# Patient Record
Sex: Male | Born: 1955 | Race: White | Hispanic: No | Marital: Married | Smoking: Former smoker
Health system: Southern US, Community
[De-identification: ages and names within clinical notes are randomized; demographics above are authoritative.]

## PROBLEM LIST (undated history)

## (undated) DIAGNOSIS — G5601 Carpal tunnel syndrome, right upper limb: Secondary | ICD-10-CM

## (undated) DIAGNOSIS — K219 Gastro-esophageal reflux disease without esophagitis: Secondary | ICD-10-CM

## (undated) DIAGNOSIS — Z98811 Dental restoration status: Secondary | ICD-10-CM

## (undated) DIAGNOSIS — M1990 Unspecified osteoarthritis, unspecified site: Secondary | ICD-10-CM

## (undated) DIAGNOSIS — I10 Essential (primary) hypertension: Secondary | ICD-10-CM

## (undated) HISTORY — PX: COLONOSCOPY: SHX174

## (undated) HISTORY — PX: NASAL SINUS SURGERY: SHX719

## (undated) HISTORY — DX: Essential (primary) hypertension: I10

## (undated) HISTORY — DX: Gastro-esophageal reflux disease without esophagitis: K21.9

---

## 1997-02-24 HISTORY — PX: LASIK: SHX215

## 1999-02-25 HISTORY — PX: SHOULDER ARTHROSCOPY W/ ROTATOR CUFF REPAIR: SHX2400

## 2000-06-30 ENCOUNTER — Emergency Department (HOSPITAL_COMMUNITY): Admission: EM | Admit: 2000-06-30 | Discharge: 2000-06-30 | Payer: Self-pay | Admitting: Emergency Medicine

## 2012-09-21 DIAGNOSIS — R509 Fever, unspecified: Secondary | ICD-10-CM

## 2013-06-30 ENCOUNTER — Telehealth: Payer: Self-pay | Admitting: Cardiology

## 2013-06-30 ENCOUNTER — Other Ambulatory Visit: Payer: Self-pay | Admitting: Cardiology

## 2013-06-30 MED ORDER — LOSARTAN POTASSIUM-HCTZ 100-12.5 MG PO TABS: 1.0000 | ORAL_TABLET | Freq: Every day | ORAL | Status: DC

## 2013-06-30 MED ORDER — LOSARTAN POTASSIUM-HCTZ 100-25 MG PO TABS: 1.0000 | ORAL_TABLET | Freq: Every day | ORAL | Status: DC

## 2013-06-30 NOTE — Telephone Encounter (Signed)
Rx refill  Losartan-HCTZ

## 2013-06-30 NOTE — Telephone Encounter (Signed)
New message     Losartan  hctz . Refill

## 2013-06-30 NOTE — Telephone Encounter (Signed)
Rx Correction

## 2013-07-07 ENCOUNTER — Other Ambulatory Visit: Payer: Self-pay | Admitting: Cardiology

## 2013-07-07 MED ORDER — LOSARTAN POTASSIUM-HCTZ 100-12.5 MG PO TABS: 1.0000 | ORAL_TABLET | Freq: Every day | ORAL | Status: DC

## 2013-10-09 ENCOUNTER — Encounter: Payer: Self-pay | Admitting: *Deleted

## 2014-01-19 ENCOUNTER — Other Ambulatory Visit: Payer: Self-pay | Admitting: Cardiology

## 2014-01-23 ENCOUNTER — Other Ambulatory Visit: Payer: Self-pay

## 2014-02-08 ENCOUNTER — Encounter: Payer: Self-pay | Admitting: Internal Medicine

## 2014-02-16 ENCOUNTER — Other Ambulatory Visit: Payer: Self-pay | Admitting: Cardiology

## 2014-03-19 ENCOUNTER — Other Ambulatory Visit: Payer: Self-pay | Admitting: Cardiology

## 2014-03-23 ENCOUNTER — Other Ambulatory Visit: Payer: Self-pay

## 2014-03-23 ENCOUNTER — Encounter: Payer: Self-pay | Admitting: Gastroenterology

## 2014-03-23 ENCOUNTER — Ambulatory Visit (INDEPENDENT_AMBULATORY_CARE_PROVIDER_SITE_OTHER): Payer: BC Managed Care – PPO | Admitting: Gastroenterology

## 2014-03-23 VITALS — BP 139/87 | HR 85 | Temp 97.8°F | Ht 68.0 in | Wt 193.8 lb

## 2014-03-23 DIAGNOSIS — K219 Gastro-esophageal reflux disease without esophagitis: Secondary | ICD-10-CM | POA: Insufficient documentation

## 2014-03-23 DIAGNOSIS — R1314 Dysphagia, pharyngoesophageal phase: Secondary | ICD-10-CM

## 2014-03-23 DIAGNOSIS — R195 Other fecal abnormalities: Secondary | ICD-10-CM | POA: Insufficient documentation

## 2014-03-23 MED ORDER — PEG 3350-KCL-NA BICARB-NACL 420 G PO SOLR: 4000.0000 mL | Freq: Once | ORAL | Status: DC

## 2014-03-23 NOTE — Telephone Encounter (Signed)
Ok to refill 

## 2014-03-23 NOTE — Assessment & Plan Note (Signed)
59 year old male with heme positive stool, noted low-volume hematochezia but without any other concerning lower GI features. Last colonoscopy around 9 years ago at age 32 and reportedly normal. Likely benign anorectal source but need to exclude occult process.   Proceed with colonoscopy with Dr. Oneida Alar in the near future. The risks, benefits, and alternatives have been discussed in detail with the patient. They state understanding and desire to proceed.

## 2014-03-23 NOTE — Progress Notes (Signed)
Primary Care Physician:  Monico Blitz, MD Primary Gastroenterologist:  Dr. Oneida Alar   Chief Complaint  Patient presents with  . HEME + STOOLS    HPI:   Derek Wilkerson is a 59 y.o. male presenting today at the request of hist PCP secondary to heme positive stool.   2.5 months ago had prostate infection, given antibiotics, started having black stool for 2 weeks. Finished antibiotics and black stool went away. Low-volume hematochezia. Bright-red tinged stool. Bristol scale #4, three times a day, usually after eating. Normal baseline for patient. No changes in bowel habits.  Last colonoscopy about 9  years ago at age 73.  No unintentional weight loss.   History of intermittent chronic GERD, placed on Protonix recently with much improvement. Occasionally will have solid food dysphagia, canned tuna, cornbread.   Past Medical History  Diagnosis Date  . Familial hyperlipidemia   . Hypertension   . Back pain   . Leg cramping   . GERD (gastroesophageal reflux disease)     Past Surgical History  Procedure Laterality Date  . Colonoscopy  10 yrs    per patient was normal  . Shoulder arthroscopy w/ rotator cuff repair      right  . Nasal sinus surgery      Current Outpatient Prescriptions  Medication Sig Dispense Refill  . losartan-hydrochlorothiazide (HYZAAR) 100-12.5 MG per tablet TAKE 1 TABLET BY MOUTH EVERY DAY 30 tablet 0  . Multiple Vitamin (MULTIVITAMIN) tablet Take 1 tablet by mouth daily.    . Omega-3 Fatty Acids (FISH OIL) 1000 MG CAPS Take by mouth daily.    . pantoprazole (PROTONIX) 40 MG tablet Take 40 mg by mouth daily.   1  . saw palmetto 500 MG capsule Take 500 mg by mouth daily.    . Testosterone 20.25 MG/ACT (1.62%) GEL Place onto the skin daily.    . polyethylene glycol-electrolytes (NULYTELY/GOLYTELY) 420 G solution Take 4,000 mLs by mouth once. 4000 mL 0   No current facility-administered medications for this visit.    Allergies as of 03/23/2014  . (No Known  Allergies)    Family History  Problem Relation Age of Onset  . Colon cancer Maternal Grandmother     History   Social History  . Marital Status: Married    Spouse Name: N/A    Number of Children: N/A  . Years of Education: N/A   Occupational History  . [employer]    Social History Main Topics  . Smoking status: Never Smoker   . Smokeless tobacco: Not on file  . Alcohol Use: 0.0 oz/week    0 Not specified per week     Comment: social drinker   . Drug Use: No  . Sexual Activity: Not on file   Other Topics Concern  . Not on file   Social History Narrative    Review of Systems: As mentioned in HPI  Physical Exam: BP 139/87 mmHg  Pulse 85  Temp(Src) 97.8 F (36.6 C) (Oral)  Ht 5\' 8"  (1.727 m)  Wt 193 lb 12.8 oz (87.907 kg)  BMI 29.47 kg/m2 General:   Alert and oriented. Pleasant and cooperative. Well-nourished and well-developed.  Head:  Normocephalic and atraumatic. Eyes:  Without icterus, sclera clear and conjunctiva pink.  Ears:  Normal auditory acuity. Nose:  No deformity, discharge,  or lesions. Mouth:  No deformity or lesions, oral mucosa pink.  Lungs:  Clear to auscultation bilaterally. No wheezes, rales, or rhonchi. No distress.  Heart:  S1, S2 present without murmurs appreciated.  Abdomen:  +BS, soft, non-tender and non-distended. No HSM noted. No guarding or rebound. No masses appreciated.  Rectal:  Deferred  Msk:  Symmetrical without gross deformities. Normal posture. Extremities:  Without clubbing or edema. Neurologic:  Alert and  oriented x4;  grossly normal neurologically. Skin:  Intact without significant lesions or rashes. Psych:  Alert and cooperative. Normal mood and affect.

## 2014-03-23 NOTE — Assessment & Plan Note (Signed)
Chronic mild intermittent GERD symptoms, improved with Protonix daily. Rare solid food dysphagia, mostly with dry foods. Risk factors for Barrett's include greater than age 60, caucasian, chronic GERD. Dysphagia likely secondary to food choices but unable to exclude occult web, ring, or stricture.   Proceed with upper endoscopy and dilatation in the near future with Dr. Oneida Alar. The risks, benefits, and alternatives have been discussed in detail with patient. They have stated understanding and desire to proceed.  Continue Protonix once daily.

## 2014-03-23 NOTE — Assessment & Plan Note (Signed)
EGD/ED as planned.  

## 2014-03-23 NOTE — Patient Instructions (Signed)
We have scheduled you for a colonoscopy, upper endoscopy, and dilation with Dr. Fields in the near future.  Further recommendations to follow!   

## 2014-03-23 NOTE — Progress Notes (Signed)
cc'ed to pcp °

## 2014-03-30 ENCOUNTER — Other Ambulatory Visit: Payer: Self-pay | Admitting: *Deleted

## 2014-03-30 MED ORDER — LOSARTAN POTASSIUM-HCTZ 100-12.5 MG PO TABS: 1.0000 | ORAL_TABLET | Freq: Every day | ORAL | Status: DC

## 2014-03-30 NOTE — Telephone Encounter (Signed)
Looks like you took care of this. Rx message kept coming up from [facility] and I just sent it back to you to address. Candee Furbish, MD

## 2014-03-30 NOTE — Telephone Encounter (Signed)
Pam, please look into this. Thanks.  Candee Furbish, MD

## 2014-03-31 ENCOUNTER — Encounter (HOSPITAL_COMMUNITY): Payer: Self-pay | Admitting: *Deleted

## 2014-03-31 ENCOUNTER — Encounter (HOSPITAL_COMMUNITY)
Admission: RE | Disposition: A | Discharge status: Home / Self Care | Payer: Self-pay | Source: Ambulatory Visit | Attending: Gastroenterology

## 2014-03-31 ENCOUNTER — Ambulatory Visit (HOSPITAL_COMMUNITY)
Admission: RE | Admit: 2014-03-31 | Discharge: 2014-03-31 | Disposition: A | Discharge status: Home / Self Care | Payer: BC Managed Care – PPO | Source: Ambulatory Visit | Attending: Gastroenterology | Admitting: Gastroenterology

## 2014-03-31 DIAGNOSIS — K2980 Duodenitis without bleeding: Secondary | ICD-10-CM | POA: Diagnosis not present

## 2014-03-31 DIAGNOSIS — R1310 Dysphagia, unspecified: Secondary | ICD-10-CM | POA: Insufficient documentation

## 2014-03-31 DIAGNOSIS — D125 Benign neoplasm of sigmoid colon: Secondary | ICD-10-CM | POA: Diagnosis not present

## 2014-03-31 DIAGNOSIS — R1314 Dysphagia, pharyngoesophageal phase: Secondary | ICD-10-CM

## 2014-03-31 DIAGNOSIS — D123 Benign neoplasm of transverse colon: Secondary | ICD-10-CM | POA: Diagnosis not present

## 2014-03-31 DIAGNOSIS — K921 Melena: Secondary | ICD-10-CM | POA: Diagnosis present

## 2014-03-31 DIAGNOSIS — K648 Other hemorrhoids: Secondary | ICD-10-CM | POA: Diagnosis not present

## 2014-03-31 DIAGNOSIS — K449 Diaphragmatic hernia without obstruction or gangrene: Secondary | ICD-10-CM | POA: Diagnosis not present

## 2014-03-31 DIAGNOSIS — R195 Other fecal abnormalities: Secondary | ICD-10-CM | POA: Insufficient documentation

## 2014-03-31 DIAGNOSIS — K222 Esophageal obstruction: Secondary | ICD-10-CM | POA: Diagnosis not present

## 2014-03-31 DIAGNOSIS — K2970 Gastritis, unspecified, without bleeding: Secondary | ICD-10-CM | POA: Insufficient documentation

## 2014-03-31 DIAGNOSIS — K219 Gastro-esophageal reflux disease without esophagitis: Secondary | ICD-10-CM

## 2014-03-31 HISTORY — PX: COLONOSCOPY: SHX5424

## 2014-03-31 HISTORY — PX: SAVORY DILATION: SHX5439

## 2014-03-31 HISTORY — PX: ESOPHAGOGASTRODUODENOSCOPY: SHX5428

## 2014-03-31 HISTORY — PX: MALONEY DILATION: SHX5535

## 2014-03-31 SURGERY — COLONOSCOPY
Anesthesia: Moderate Sedation

## 2014-03-31 MED ORDER — MIDAZOLAM HCL 5 MG/5ML IJ SOLN
INTRAMUSCULAR | Status: AC
  Filled 2014-03-31: qty 10

## 2014-03-31 MED ORDER — MEPERIDINE HCL 100 MG/ML IJ SOLN
INTRAMUSCULAR | Status: AC
  Filled 2014-03-31: qty 2

## 2014-03-31 MED ORDER — SPOT INK MARKER SYRINGE KIT
PACK | SUBMUCOSAL | Status: DC | PRN
  Administered 2014-03-31: 2 mL via SUBMUCOSAL

## 2014-03-31 MED ORDER — LIDOCAINE VISCOUS 2 % MT SOLN
OROMUCOSAL | Status: AC
  Filled 2014-03-31: qty 15

## 2014-03-31 MED ORDER — PROMETHAZINE HCL 25 MG/ML IJ SOLN
INTRAMUSCULAR | Status: DC | PRN
  Administered 2014-03-31: 12.5 mg via INTRAVENOUS

## 2014-03-31 MED ORDER — SODIUM CHLORIDE 0.9 % IV SOLN
INTRAVENOUS | Status: DC
  Administered 2014-03-31: 13:00:00 via INTRAVENOUS

## 2014-03-31 MED ORDER — LIDOCAINE VISCOUS 2 % MT SOLN
OROMUCOSAL | Status: DC | PRN
  Administered 2014-03-31: 1 via OROMUCOSAL

## 2014-03-31 MED ORDER — SODIUM CHLORIDE 0.9 % IJ SOLN
INTRAMUSCULAR | Status: AC
  Filled 2014-03-31: qty 3

## 2014-03-31 MED ORDER — STERILE WATER FOR IRRIGATION IR SOLN
Status: DC | PRN
  Administered 2014-03-31: 13:00:00

## 2014-03-31 MED ORDER — PANTOPRAZOLE SODIUM 40 MG PO TBEC: 40.0000 mg | DELAYED_RELEASE_TABLET | Freq: Two times a day (BID) | ORAL | Status: DC

## 2014-03-31 MED ORDER — MIDAZOLAM HCL 5 MG/5ML IJ SOLN
INTRAMUSCULAR | Status: DC | PRN
  Administered 2014-03-31 (×2): 2 mg via INTRAVENOUS
  Administered 2014-03-31: 1 mg via INTRAVENOUS
  Administered 2014-03-31: 2 mg via INTRAVENOUS

## 2014-03-31 MED ORDER — PROMETHAZINE HCL 25 MG/ML IJ SOLN
INTRAMUSCULAR | Status: AC
  Filled 2014-03-31: qty 1

## 2014-03-31 MED ORDER — MEPERIDINE HCL 100 MG/ML IJ SOLN
INTRAMUSCULAR | Status: DC | PRN
  Administered 2014-03-31: 50 mg via INTRAVENOUS
  Administered 2014-03-31: 25 mg via INTRAVENOUS

## 2014-03-31 NOTE — Discharge Instructions (Signed)
You had 2 polyps removed. I TATTOOED THE BASE OF THE LARGEST ONE. YOUR RECTAL BLEEDING IS DUE TO THE LARGE POLYP AND HEMORRHOIDS. You have internal hemorrhoids and diverticulosis IN YOUR LEFT COLON. 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 You have MILD gastritis AND DUODENITIS. I biopsied your stomach.    CONTINUE PROTONIX. INCREASE TO 30 MINUTES PRIOR TO YOUR FIRST MEAL FOREVER.   FOLLOW A HIGH FIBER/LOW FAT DIET. AVOID ITEMS THAT CAUSE BLOATING. SEE INFO BELOW.  YOUR BIOPSY RESULTS WILL BE AVAILABLE IN MY CHART AFTER FEB 9  OR MY OFFICE WILL CONTACT YOU IN 10-14 DAYS WITH YOUR RESULTS.   FOLLOW UP IN 3 MOS.  Next colonoscopy in 1-3 years. YOUR SISTERS, BROTHERS, CHILDREN, AND PARENTS NEED TO HAVE A COLONOSCOPY STARTING AT THE AGE OF 40.    ENDOSCOPY Care After Read the instructions outlined below and refer to this sheet in the next week. These discharge instructions provide you with general information on caring for yourself after you leave the hospital. While your treatment has been planned according to the most current medical practices available, unavoidable complications occasionally occur. If you have any problems or questions after discharge, call DR. Wilhelmenia Addis, [phone].  ACTIVITY  You may resume your regular activity, but move at a slower pace for the next 24 hours.   Take frequent rest periods for the next 24 hours.   Walking will help get rid of the air and reduce the bloated feeling in your belly (abdomen).   No driving for 24 hours (because of the medicine (anesthesia) used during the test).   You may shower.   Do not sign any important legal documents or operate any machinery for 24 hours (because of the anesthesia used during the test).    NUTRITION  Drink plenty of fluids.   You may resume your normal diet as instructed by your doctor.   Begin with a light meal and progress to your normal diet. Heavy or fried foods are harder to digest and may make you  feel sick to your stomach (nauseated).   Avoid alcoholic beverages for 24 hours or as instructed.    MEDICATIONS  You may resume your normal medications.   WHAT YOU CAN EXPECT TODAY  Some feelings of bloating in the abdomen.   Passage of more gas than usual.   Spotting of blood in your stool or on the toilet paper  .  IF YOU HAD POLYPS REMOVED DURING THE ENDOSCOPY:  Eat a soft diet IF YOU HAVE NAUSEA, BLOATING, ABDOMINAL PAIN, OR VOMITING.    FINDING OUT THE RESULTS OF YOUR TEST Not all test results are available during your visit. DR. Oneida Alar WILL CALL YOU WITHIN 14 DAYS OF YOUR PROCEDUE WITH YOUR RESULTS. Do not assume everything is normal if you have not heard from DR. Eldor Conaway, CALL HER OFFICE AT [phone].  SEEK IMMEDIATE MEDICAL ATTENTION AND CALL THE OFFICE: [phone] IF:  You have more than a spotting of blood in your stool.   Your belly is swollen (abdominal distention).   You are nauseated or vomiting.   You have a temperature over 101F.   You have abdominal pain or discomfort that is severe or gets worse throughout the day.  High-Fiber Diet A high-fiber diet changes your normal diet to include more whole grains, legumes, fruits, and vegetables. Changes in the diet involve replacing refined carbohydrates with unrefined foods. The calorie level of the diet is essentially unchanged. The Dietary Reference Intake (recommended amount) for  adult males is 38 grams per day. For adult females, it is 25 grams per day. Pregnant and lactating women should consume 28 grams of fiber per day. Fiber is the intact part of a plant that is not broken down during digestion. Functional fiber is fiber that has been isolated from the plant to provide a beneficial effect in the body. PURPOSE  Increase stool bulk.   Ease and regulate bowel movements.   Lower cholesterol.  INDICATIONS THAT YOU NEED MORE FIBER  Constipation and hemorrhoids.   Uncomplicated diverticulosis  (intestine condition) and irritable bowel syndrome.   Weight management.   As a protective measure against hardening of the arteries (atherosclerosis), diabetes, and cancer.   GUIDELINES FOR INCREASING FIBER IN THE DIET  Start adding fiber to the diet slowly. A gradual increase of about 5 more grams (2 slices of whole-wheat bread, 2 servings of most fruits or vegetables, or 1 bowl of high-fiber cereal) per day is best. Too rapid an increase in fiber may result in constipation, flatulence, and bloating.   Drink enough water and fluids to keep your urine clear or pale yellow. Water, juice, or caffeine-free drinks are recommended. Not drinking enough fluid may cause constipation.   Eat a variety of high-fiber foods rather than one type of fiber.   Try to increase your intake of fiber through using high-fiber foods rather than fiber pills or supplements that contain small amounts of fiber.   The goal is to change the types of food eaten. Do not supplement your present diet with high-fiber foods, but replace foods in your present diet.  INCLUDE A VARIETY OF FIBER SOURCES  Replace refined and processed grains with whole grains, canned fruits with fresh fruits, and incorporate other fiber sources. White rice, white breads, and most bakery goods contain little or no fiber.   Brown whole-grain rice, buckwheat oats, and many fruits and vegetables are all good sources of fiber. These include: broccoli, Brussels sprouts, cabbage, cauliflower, beets, sweet potatoes, white potatoes (skin on), carrots, tomatoes, eggplant, squash, berries, fresh fruits, and dried fruits.   Cereals appear to be the richest source of fiber. Cereal fiber is found in whole grains and bran. Bran is the fiber-rich outer coat of cereal grain, which is largely removed in refining. In whole-grain cereals, the bran remains. In breakfast cereals, the largest amount of fiber is found in those with "bran" in their names. The fiber content  is sometimes indicated on the label.   You may need to include additional fruits and vegetables each day.   In baking, for 1 cup white flour, you may use the following substitutions:   1 cup whole-wheat flour minus 2 tablespoons.   1/2 cup white flour plus 1/2 cup whole-wheat flour.    Low-Fat Diet BREADS, CEREALS, PASTA, RICE, DRIED PEAS, AND BEANS These products are high in carbohydrates and most are low in fat. Therefore, they can be increased in the diet as substitutes for fatty foods. They too, however, contain calories and should not be eaten in excess. Cereals can be eaten for snacks as well as for breakfast.  Include foods that contain fiber (fruits, vegetables, whole grains, and legumes). Research shows that fiber may lower blood cholesterol levels, especially the water-soluble fiber found in fruits, vegetables, oat products, and legumes. FRUITS AND VEGETABLES It is good to eat fruits and vegetables. Besides being sources of fiber, both are rich in vitamins and some minerals. They help you get the daily allowances of these nutrients.  Fruits and vegetables can be used for snacks and desserts. MEATS Limit lean meat, chicken, [place], and fish to no more than 6 ounces per day. Beef, Pork, and Lamb Use lean cuts of beef, pork, and lamb. Lean cuts include:  Extra-lean ground beef.  Arm roast.  Sirloin tip.  Center-cut ham.  Round steak.  Loin chops.  Rump roast.  Tenderloin.  Trim all fat off the outside of meats before cooking. It is not necessary to severely decrease the intake of red meat, but lean choices should be made. Lean meat is rich in protein and contains a highly absorbable form of iron. Premenopausal women, in particular, should avoid reducing lean red meat because this could increase the risk for low red blood cells (iron-deficiency anemia).  Chicken and [place] These are good sources of protein. The fat of poultry can be reduced by removing the skin and underlying fat  layers before cooking. Chicken and [place] can be substituted for lean red meat in the diet. Poultry should not be fried or covered with high-fat sauces. Fish and Shellfish Fish is a good source of protein. Shellfish contain cholesterol, but they usually are low in saturated fatty acids. The preparation of fish is important. Like chicken and [place], they should not be fried or covered with high-fat sauces. EGGS Egg whites contain no fat or cholesterol. They can be eaten often. Try 1 to 2 egg whites instead of whole eggs in recipes or use egg substitutes that do not contain yolk.  MILK AND DAIRY PRODUCTS Use skim or 1% milk instead of 2% or whole milk. Decrease whole milk, natural, and processed cheeses. Use nonfat or low-fat (2%) cottage cheese or low-fat cheeses made from vegetable oils. Choose nonfat or low-fat (1 to 2%) yogurt. Experiment with evaporated skim milk in recipes that call for heavy cream. Substitute low-fat yogurt or low-fat cottage cheese for sour cream in dips and salad dressings. Have at least 2 servings of low-fat dairy products, such as 2 glasses of skim (or 1%) milk each day to help get your daily calcium intake.  FATS AND OILS Butterfat, lard, and beef fats are high in saturated fat and cholesterol. These should be avoided.Vegetable fats do not contain cholesterol. AVOID coconut oil, palm oil, and palm kernel oil, WHICH are very high in saturated fats. These should be limited. These fats are often used in bakery goods, processed foods, popcorn, oils, and nondairy creamers. Vegetable shortenings and some peanut butters contain hydrogenated oils, which are also saturated fats. Read the labels on these foods and check for saturated vegetable oils.  Desirable liquid vegetable oils are corn oil, cottonseed oil, olive oil, canola oil, safflower oil, soybean oil, and sunflower oil. Peanut oil is not as good, but small amounts are acceptable. Buy a heart-healthy tub margarine that has no  partially hydrogenated oils in the ingredients. AVOID Mayonnaise and salad dressings often are made from unsaturated fats.  OTHER EATING TIPS Snacks  Most sweets should be limited as snacks. They tend to be rich in calories and fats, and their caloric content outweighs their nutritional value. Some good choices in snacks are graham crackers, melba toast, soda crackers, bagels (no egg), English muffins, fruits, and vegetables. These snacks are preferable to snack crackers, [place] fries, and chips. Popcorn should be air-popped or cooked in small amounts of liquid vegetable oil.  Desserts Eat fruit, low-fat yogurt, and fruit ices instead of pastries, cake, and cookies. Sherbet, angel food cake, gelatin dessert, frozen low-fat yogurt, or  other frozen products that do not contain saturated fat (pure fruit juice bars, frozen ice pops) are also acceptable.   COOKING METHODS Choose those methods that use little or no fat. They include: Poaching.  Braising.  Steaming.  Grilling.  Baking.  Stir-frying.  Broiling.  Microwaving.  Foods can be cooked in a nonstick pan without added fat, or use a nonfat cooking spray in regular cookware. Limit fried foods and avoid frying in saturated fat. Add moisture to lean meats by using water, broth, cooking wines, and other nonfat or low-fat sauces along with the cooking methods mentioned above. Soups and stews should be chilled after cooking. The fat that forms on top after a few hours in the refrigerator should be skimmed off. When preparing meals, avoid using excess salt. Salt can contribute to raising blood pressure in some people.  EATING AWAY FROM HOME Order entres, potatoes, and vegetables without sauces or butter. When meat exceeds the size of a deck of cards (3 to 4 ounces), the rest can be taken home for another meal. Choose vegetable or fruit salads and ask for low-calorie salad dressings to be served on the side. Use dressings sparingly. Limit high-fat  toppings, such as bacon, crumbled eggs, cheese, sunflower seeds, and olives. Ask for heart-healthy tub margarine instead of butter.   Polyps, Colon  A polyp is extra tissue that grows inside your body. Colon polyps grow in the large intestine. The large intestine, also called the colon, is part of your digestive system. It is a long, hollow tube at the end of your digestive tract where your body makes and stores stool. Most polyps are not dangerous. They are benign. This means they are not cancerous. But over time, some types of polyps can turn into cancer. Polyps that are smaller than a pea are usually not harmful. But larger polyps could someday become or may already be cancerous. To be safe, doctors remove all polyps and test them.   WHO GETS POLYPS? Anyone can get polyps, but certain people are more likely than others. You may have a greater chance of getting polyps if:  You are over 50.   You have had polyps before.   Someone in your family has had polyps.   Someone in your family has had cancer of the large intestine.   Find out if someone in your family has had polyps. You may also be more likely to get polyps if you:   Eat a lot of fatty foods   Smoke   Drink alcohol   Do not exercise  Eat too much   TREATMENT  The caregiver will remove the polyp during sigmoidoscopy or colonoscopy.  PREVENTION There is not one sure way to prevent polyps. You might be able to lower your risk of getting them if you:  Eat more fruits and vegetables and less fatty food.   Do not smoke.   Avoid alcohol.   Exercise every day.   Lose weight if you are overweight.   Eating more calcium and folate can also lower your risk of getting polyps. Some foods that are rich in calcium are milk, cheese, and broccoli. Some foods that are rich in folate are chickpeas, kidney beans, and spinach.   Diverticulosis Diverticulosis is a common condition that develops when small pouches (diverticula) form  in the wall of the colon. The risk of diverticulosis increases with age. It happens more often in people who eat a low-fiber diet. Most individuals with diverticulosis have no  symptoms. Those individuals with symptoms usually experience belly (abdominal) pain, constipation, or loose stools (diarrhea).  HOME CARE INSTRUCTIONS  Increase the amount of fiber in your diet as directed by your caregiver or dietician. This may reduce symptoms of diverticulosis.   Drink at least 6 to 8 glasses of water each day to prevent constipation.   Try not to strain when you have a bowel movement.   Avoiding nuts and seeds to prevent complications is still an uncertain benefit.   FOODS HAVING HIGH FIBER CONTENT INCLUDE:  Fruits. Apple, peach, pear, tangerine, raisins, prunes.   Vegetables. Brussels sprouts, asparagus, broccoli, cabbage, carrot, cauliflower, romaine lettuce, spinach, summer squash, tomato, winter squash, zucchini.   Starchy Vegetables. Baked beans, kidney beans, lima beans, split peas, lentils, potatoes (with skin).   Grains. Whole wheat bread, brown rice, bran flake cereal, plain oatmeal, white rice, shredded wheat, bran muffins.   SEEK IMMEDIATE MEDICAL CARE IF:  You develop increasing pain or severe bloating.   You have an oral temperature above 101F.   You develop vomiting or bowel movements that are bloody or black.   Hemorrhoids Hemorrhoids are dilated (enlarged) veins around the rectum. Sometimes clots will form in the veins. This makes them swollen and painful. These are called thrombosed hemorrhoids. Causes of hemorrhoids include:  Constipation.   Straining to have a bowel movement.   HEAVY LIFTING HOME CARE INSTRUCTIONS  Eat a well balanced diet and drink 6 to 8 glasses of water every day to avoid constipation. You may also use a bulk laxative.   Avoid straining to have bowel movements.   Keep anal area dry and clean.   Do not use a donut shaped pillow or sit on  the toilet for long periods. This increases blood pooling and pain.   Move your bowels when your body has the urge; this will require less straining and will decrease pain and pressure.

## 2014-03-31 NOTE — Progress Notes (Signed)
REVIEWED-NO ADDITIONAL RECOMMENDATIONS. 

## 2014-03-31 NOTE — H&P (Signed)
  Primary Care Physician:  Monico Blitz, MD Primary Gastroenterologist:  Dr. Oneida Alar  Pre-Procedure History & Physical: HPI:  Derek Wilkerson is a 59 y.o. male here for SCREENING/dyspepsia/DYSPHAGIA.  Past Medical History  Diagnosis Date  . Familial hyperlipidemia   . Hypertension   . Back pain   . Leg cramping   . GERD (gastroesophageal reflux disease)    Past Surgical History  Procedure Laterality Date  . Colonoscopy  10 yrs    per patient was normal  . Shoulder arthroscopy w/ rotator cuff repair      right  . Nasal sinus surgery     Prior to Admission medications   Medication Sig Start Date End Date Taking? Authorizing Provider  Cholecalciferol (VITAMIN D-3) 1000 UNITS CAPS Take 2 capsules by mouth daily.   Yes Historical Provider, MD  glucosamine-chondroitin 500-400 MG tablet Take 1 tablet by mouth 3 (three) times daily.   Yes Historical Provider, MD  OVER THE COUNTER MEDICATION Take 1 capsule by mouth daily. Super Beta Prostate   Yes Historical Provider, MD  pantoprazole (PROTONIX) 40 MG tablet Take 40 mg by mouth daily.  03/14/14  Yes Historical Provider, MD  pyridOXINE (VITAMIN B-6) 100 MG tablet Take 100 mg by mouth daily.   Yes Historical Provider, MD  saw palmetto 500 MG capsule Take 500 mg by mouth 2 (two) times daily.    Yes Historical Provider, MD  sertraline (ZOLOFT) 100 MG tablet Take 150 mg by mouth daily. 02/16/14  Yes Historical Provider, MD  Testosterone 20.25 MG/ACT (1.62%) GEL Place onto the skin daily.   Yes Historical Provider, MD  losartan-hydrochlorothiazide (HYZAAR) 100-12.5 MG per tablet Take 1 tablet by mouth daily. 03/30/14   Candee Furbish, MD  polyethylene glycol-electrolytes (NULYTELY/GOLYTELY) 420 G solution Take 4,000 mLs by mouth once. 03/23/14   Orvil Feil, NP    Allergies as of 03/23/2014  . (No Known Allergies)    Family History  Problem Relation Age of Onset  . Colon cancer Maternal Grandmother     History   Social History  . Marital Status:  Married    Spouse Name: N/A    Number of Children: N/A  . Years of Education: N/A   Occupational History  . [employer]    Social History Main Topics  . Smoking status: Never Smoker   . Smokeless tobacco: Not on file  . Alcohol Use: 0.0 oz/week    0 Not specified per week     Comment: social drinker   . Drug Use: No  . Sexual Activity: Not on file   Other Topics Concern  . Not on file   Social History Narrative    Review of Systems: See HPI, otherwise negative ROS   Physical Exam: There were no vitals taken for this visit. General:   Alert,  pleasant and cooperative in NAD Head:  Normocephalic and atraumatic. Neck:  Supple; Lungs:  Clear throughout to auscultation.    Heart:  Regular rate and rhythm. Abdomen:  Soft, nontender and nondistended. Normal bowel sounds, without guarding, and without rebound.   Neurologic:  Alert and  oriented x4;  grossly normal neurologically.  Impression/Plan:    SCREENING/dyspepsia/DYSPHAGIA. Plan:  1. TCS/EGD/?DIL TODAY

## 2014-04-01 NOTE — Op Note (Signed)
Derek Wilkerson, Derek Wilkerson                  ACCOUNT NO.:  [id number]  MEDICAL RECORD NO.:  [id number]  LOCATION:  [facility]                          FACILITY:  [facility]  PHYSICIAN:  Barney Drain, M.D.     DATE OF BIRTH:  1955-07-23  DATE OF PROCEDURE:  03/31/2014 DATE OF DISCHARGE:  03/31/2014                              OPERATIVE REPORT   REFERRING PROVIDER:  Monico Blitz, MD  PROCEDURE:  Esophagogastroduodenoscopy with cold forceps biopsy and Savary dilation to 16 mm.  INDICATION FOR EXAM:  Derek Wilkerson is a 59 year old male who presents with black stool after taking antibiotics.  He also has intermittent solid dysphagia.  He also has a history of gastroesophageal reflux disease.  FINDINGS: 1. Distal esophageal stricture associated with erosion and erythema     that narrows or limited to approximately 12 to 13 mm. 2. Moderate hiatal hernia. 3. Mild erythema and edema without erosions in the antrum. 4. Mild erythema and edema in the duodenal bulb.  Normal second     portion of the duodenum.  IMPRESSION: 1. Peptic stricture. 2. Moderate-sized hiatal hernia. 3. Mild gastritis and duodenitis.  No obvious source for black stools     identified.  RECOMMENDATIONS: 1. Protonix 30 minutes before meals twice daily. 2. Await biopsy. 3. Follow up in 3 months.  MEDICATIONS:  In addition to meds given during colonoscopy, Demerol 25 mg IV and Versed 2 mg IV.  PROCEDURE TECHNIQUE:  Physical exam was performed.  Informed consent was obtained from the patient after explaining the benefits, risks, and alternatives to the procedure.  The patient was connected to the monitor and placed in left lateral position.  Continuous oxygen was provided by nasal cannula and IV medicine administered through an indwelling cannula.  After administration of sedation, the patient's esophagus was intubated and the scope was advanced under direct visualization to the second portion of the duodenum.  Prior to withdrawal of the  scope, the Savary guidewire was placed.  The scope was removed slowly by careful examining the color, texture, anatomy, and integrity of the mucosa on the way out.  The esophagus was dilated from 12.8 to 16 mm.  A 16 MM dilator was passed with mild resistance.  Trace heme was evident after dilation.  The dilator and the guidewire were removed.  The patient was recovered in endoscopy and discharged home in satisfactory condition.     Barney Drain, M.D.     SF/MEDQ  D:  03/31/2014  T:  04/01/2014  Job:  [id number]

## 2014-04-01 NOTE — Op Note (Signed)
NAMEUDAY, JANTZ                  ACCOUNT NO.:  [id number]  MEDICAL RECORD NO.:  [id number]  LOCATION:  [facility]                          FACILITY:  [facility]  PHYSICIAN:  Barney Drain, M.D.     DATE OF BIRTH:  [birth year]  DATE OF PROCEDURE:  03/31/2014 DATE OF DISCHARGE:  03/31/2014                              OPERATIVE REPORT   PROCEDURE:  Colonoscopy with cold forceps, snare cautery, polypectomy, and spot tattoo injection.  REFERRING PROVIDER:  Monico Blitz, MD  INDICATION FOR EXAM:  Mr. Glasscock is a 59 year old male who complained of black stool and rectal bleeding.  FINDINGS: 1. Normal terminal ileum approximately 10 cm visualized. 2. Redundant left colon. 3. A 1.2 cm semi-pedunculated sigmoid colon polyp removed via snare     cautery.  1 mL spot tattoo injected at the base. 4. A 4 mm sessile hepatic flexure polyp removed via cold forceps. 5. Frequent sigmoid diverticula with angulation. 6. Moderate internal and external hemorrhoids.  IMPRESSION: 1. Rectal bleeding due to large sigmoid colon polyp and internal     hemorrhoids. 2. Moderate external hemorrhoids. 3. Moderate diverticulosis with muscular hypertrophy.  RECOMMENDATIONS: 1. Await biopsies. 2. High-fiber diet. 3. Next colonoscopy in 1 to 3 years.  MEDICATIONS: 1. Demerol 75 mg IV. 2. Versed 5 mg IV. 3. Phenergan 12.5 mg IV.  CECAL WITHDRAWAL TIME:  25 minutes.  PROCEDURE TECHNIQUE:  Physical exam was performed.  Informed consent was obtained from the patient after explaining the benefits, risks, and alternatives to procedure.  The patient was connected monitored and placed in left lateral position.  Continuous oxygen was provided by nasal cannula.  IV medicine administered through an indwelling cannula. After administration of sedation and rectal exam, the patient's rectum was intubated and scope advanced under direct visualization to the distal terminal ileum.  The scope was removed slowly by careful exam of  color, texture, anatomy, and integrity of the mucosa on the way out.  Patient was recovered in endoscopy.  Discharged home in satisfactory condition.     Barney Drain, M.D.     SF/MEDQ  D:  03/31/2014  T:  04/01/2014  Job:  [id number]  cc:   Monico Blitz, MD Fax: [phone]

## 2014-04-02 NOTE — Op Note (Addendum)
[facility] [address] [place], [address]   COLONOSCOPY PROCEDURE REPORT  PATIENT: Raine, Elsass  MR#: [id number] BIRTHDATE: Aug 23, 1955 , 31  yrs. old GENDER: male ENDOSCOPIST: [name], MD REFERRED KX:[id number] Manuella Ghazi, M.D. PROCEDURE DATE:  03/31/2014 PROCEDURE:   Colonoscopy with cold biopsy polypectomy, Colonoscopy with snare polypectomy, and Submucosal injection, any substance INDICATIONS:hematochezia. MEDICATIONS: Demerol 50 mg IV and Versed 5 mg IV  DESCRIPTION OF PROCEDURE:    Physical exam was performed.  Informed consent was obtained from the patient after explaining the benefits, risks, and alternatives to procedure.  The patient was connected to monitor and placed in left lateral position. Continuous oxygen was provided by nasal cannula and IV medicine administered through an indwelling cannula.  After administration of sedation and rectal exam, the patients rectum was intubated and the EC-3890Li ([id number])  colonoscope was advanced under direct visualization to the ileum.  The scope was removed slowly by carefully examining the color, texture, anatomy, and integrity mucosa on the way out.  The patient was recovered in endoscopy and discharged home in satisfactory condition.    COLON FINDINGS: A sessile polyp measuring 3 mm in size was found.  A polypectomy was performed with cold forceps.  , A semi-pedunculated polyp measuring 1.2 cm in size was found in the sigmoid colon.  A polypectomy was performed using snare cautery.  , 1-2 cc SPOT APPLIED AT BASE OF SIGMOID COLON POLYP, and Moderate sized internal hemorrhoids were found.  PREP QUALITY: good. CECAL W/D TIME: 15       minutes COMPLICATIONS: None  ENDOSCOPIC IMPRESSION: ONE LARGE SIGMOID COLON POLYP REMOVED ONE SMALL TRANSVERSE COLON POLYP REMOVED MODERATE INTERNAL HEMORRHOIDS  RECOMMENDATIONS: PROTONIX BID LOW FAT HIGH FIBER DIET AWAIT BIOPSY NEXT TCS IN ONE TO THREE  YEARS      _______________________________ eSignedDanie Binder, MD April 23, 2014 1:22 PM Revised: 2014-04-23 1:22 PM  CPT CODES: ICD CODES:  The ICD and CPT codes recommended by this software are interpretations from the data that the clinical staff has captured with the software.  The verification of the translation of this report to the ICD and CPT codes and modifiers is the sole responsibility of the health care institution and practicing physician where this report was generated.  [employer]. will not be held responsible for the validity of the ICD and CPT codes included on this report.  AMA assumes no liability for data contained or not contained herein. CPT is a Designer, television/film set of the [employer].

## 2014-04-02 NOTE — Op Note (Signed)
[facility] [address] [place], [address]   ENDOSCOPY PROCEDURE REPORT  PATIENT: Derek Wilkerson, Derek Wilkerson  MR#: [id number] BIRTHDATE: [birth year] , 27  yrs. old GENDER: male  ENDOSCOPIST: [name], MD REFFERED IF:OYDXAJ Manuella Ghazi, M.D.  PROCEDURE DATE:  03/31/2014 PROCEDURE:   EGD with biopsy and EGD with dilatation over guidewire   INDICATIONS:1.  hemoccult positive stools.   2.  dysphagia. MEDICATIONS: TCS +  Demerol 25 mg IV and Versed 2 mg IV TOPICAL ANESTHETIC: Viscous Xylocaine  DESCRIPTION OF PROCEDURE:   After the risks benefits and alternatives of the procedure were thoroughly explained, informed consent was obtained.  The EC-3890Li ([id number])  endoscope was introduced through the mouth and advanced to the second portion of the duodenum. The instrument was slowly withdrawn as the mucosa was carefully examined.  Prior to withdrawal of the scope, the guidwire was placed.  The esophagus was dilated successfully.  The patient was recovered in endoscopy and discharged home in satisfactory condition.   ESOPHAGUS: A stricture was found at the gastroesophageal junction. The stenosis was non traversable with the endoscope. STOMACH: Mild non-erosive gastritis (inflammation) was found in the gastric antrum.  Multiple biopsies were performed using cold forceps.   A hiatal hernia was found.   DUODENUM: Mild duodenal inflammation was found in the duodenal bulb.   The duodenal mucosa showed no abnormalities in the 2nd part of the duodenum.   Dilation was then performed at the gastroesphageal junction Dilator: Savary over guidewire Size(s): 72.0-94BS Heme: yes  COMPLICATIONS: There were no immediate complications.  ENDOSCOPIC IMPRESSION: 1.   Stricture at the gastroesophageal junction 2.   MILD Non-erosive gastritis  AND DUODENITIS 3.   MODERATE Hiatal hernia  RECOMMENDATIONS: PROTONIX BID LOW FAT HIGH FIBER DIET AWAIT BIOPSY OPV IN 3 mos TCS IN ONE TO 3  YEARS  eSigned:  [name], MD April 23, 2014 1:49 PM   CPT CODES: ICD CODES:  The ICD and CPT codes recommended by this software are interpretations from the data that the clinical staff has captured with the software.  The verification of the translation of this report to the ICD and CPT codes and modifiers is the sole responsibility of the health care institution and practicing physician where this report was generated.  [facility]. will not be held responsible for the validity of the ICD and CPT codes included on this report.  AMA assumes no liability for data contained or not contained herein. CPT is a Designer, television/film set of the [employer].

## 2014-04-04 ENCOUNTER — Encounter: Payer: Self-pay | Admitting: Gastroenterology

## 2014-04-04 ENCOUNTER — Encounter (HOSPITAL_COMMUNITY): Payer: Self-pay | Admitting: Gastroenterology

## 2014-04-10 ENCOUNTER — Telehealth: Payer: Self-pay | Admitting: Gastroenterology

## 2014-04-10 NOTE — Telephone Encounter (Signed)
Please call pt. He had A LARGE simple adenoma, AND A SMALL SERRATED ADENOMA removed from hIS colon. His stomach Bx shows gastritis.   CONTINUE PROTONIX. INCREASE TO 30 MINUTES PRIOR TO YOUR FIRST MEAL FOREVER.   FOLLOW A HIGH FIBER/LOW FAT DIET. AVOID ITEMS THAT CAUSE BLOATING.   FOLLOW UP IN 3 MOS E30 DYSPHAGIA, COLON POLYPS, GERD  Next colonoscopy in 3 years. YOUR SISTERS, BROTHERS, CHILDREN, AND PARENTS NEED TO HAVE A COLONOSCOPY STARTING AT THE AGE OF 40.

## 2014-04-11 ENCOUNTER — Encounter: Payer: Self-pay | Admitting: Gastroenterology

## 2014-04-11 NOTE — Telephone Encounter (Signed)
APPOINTMENT MADE AND LETTER SENT, ON RECALL FOR TCS °

## 2014-04-12 NOTE — Telephone Encounter (Signed)
LMOM to call.

## 2014-04-12 NOTE — Telephone Encounter (Signed)
Pt is aware of results. 

## 2014-05-17 ENCOUNTER — Telehealth: Payer: Self-pay

## 2014-05-17 NOTE — Telephone Encounter (Signed)
PLEASE CALL PT. WE WILL ATTEMPT TO GET INSURANCE CO TO APPROVE BID PROTONIX. IN THE MEANTIME HE SHOULD AVOID REFLUX TRIGGERS. LOSE 20 LBS IN THE NEXT 6 MOS. 10 LBS. STRICTLY FOLLOW A LOW FAT DIET.

## 2014-05-17 NOTE — Telephone Encounter (Signed)
Pt is aware.  

## 2014-05-17 NOTE — Telephone Encounter (Signed)
Pt is calling because his insurance will not pay for him to take the Protoinx BID. Please advise what we need to do.

## 2014-05-18 NOTE — Telephone Encounter (Signed)
Working on PA

## 2014-07-10 ENCOUNTER — Ambulatory Visit: Payer: BC Managed Care – PPO | Admitting: Gastroenterology

## 2014-07-27 ENCOUNTER — Encounter: Payer: Self-pay | Admitting: Gastroenterology

## 2014-07-27 ENCOUNTER — Ambulatory Visit (INDEPENDENT_AMBULATORY_CARE_PROVIDER_SITE_OTHER): Payer: BC Managed Care – PPO | Admitting: Gastroenterology

## 2014-07-27 VITALS — BP 143/86 | HR 70 | Temp 97.6°F | Ht 69.0 in | Wt 198.4 lb

## 2014-07-27 DIAGNOSIS — K219 Gastro-esophageal reflux disease without esophagitis: Secondary | ICD-10-CM | POA: Diagnosis not present

## 2014-07-27 DIAGNOSIS — R195 Other fecal abnormalities: Secondary | ICD-10-CM | POA: Diagnosis not present

## 2014-07-27 NOTE — Progress Notes (Signed)
Referring Provider: Monico Blitz, MD Primary Care Physician:  Monico Blitz, MD  Primary GI: Dr. Oneida Alar   Chief Complaint  Patient presents with  . Follow-up    HPI:   Derek Wilkerson is a 59 y.o. male returns in follow-up after colonoscopy for heme positive stool and EGD for solid food dysphagia. Needs colonoscopy in 3 years due to large adenoma in sigmoid (1.2cm).   Dysphagia resolved. Protonix once daily. Can't remember to do it twice a day. No hematochezia. No abdominal pain, appetite is good. No N/V. Overall, no GI concerns.   Past Medical History  Diagnosis Date  . Familial hyperlipidemia   . Hypertension   . Back pain   . Leg cramping   . GERD (gastroesophageal reflux disease)     Past Surgical History  Procedure Laterality Date  . Colonoscopy  10 yrs    per patient was normal  . Shoulder arthroscopy w/ rotator cuff repair      right  . Nasal sinus surgery    . Colonoscopy N/A 03/31/2014    Dr. Oneida Alar: One large sigmoid polyp removed. One small transverse colon polyp removed. Moderate internal hemorrhoids. path with sessile serrated polyp and leiomyoma of transverse colon, sigmoid tubular adenoma. Surveillance 2019  . Esophagogastroduodenoscopy N/A 03/31/2014    Dr. Oneida Alar: 1. Stricture at the gastroesophageal junction 2. mild non-erosive gastritis and duodentitis 3. moderate hiatal hernia  . Savory dilation N/A 03/31/2014    Procedure: SAVORY DILATION;  Surgeon: [name], MD;  Location: [facility];  Service: Endoscopy;  Laterality: N/A;  [name] dilation N/A 03/31/2014    Procedure: [name] DILATION;  Surgeon: [name], MD;  Location: [facility];  Service: Endoscopy;  Laterality: N/A;    Current Outpatient Prescriptions  Medication Sig Dispense Refill  . Cholecalciferol (VITAMIN D-3) 1000 UNITS CAPS Take 2 capsules by mouth daily.    Marland Kitchen glucosamine-chondroitin 500-400 MG tablet Take 1 tablet by mouth 3 (three) times daily.    Marland Kitchen  losartan-hydrochlorothiazide (HYZAAR) 100-12.5 MG per tablet Take 1 tablet by mouth daily. 30 tablet 3  . pantoprazole (PROTONIX) 40 MG tablet Take 1 tablet (40 mg total) by mouth 2 (two) times daily before a meal. 60 tablet 11  . pyridOXINE (VITAMIN B-6) 100 MG tablet Take 100 mg by mouth daily.    . saw palmetto 500 MG capsule Take 500 mg by mouth 2 (two) times daily.     . sertraline (ZOLOFT) 100 MG tablet Take 150 mg by mouth daily.  2  . Testosterone 20.25 MG/ACT (1.62%) GEL Place onto the skin daily.    Marland Kitchen OVER THE COUNTER MEDICATION Take 1 capsule by mouth daily. Super Beta Prostate     No current facility-administered medications for this visit.    Allergies as of 07/27/2014  . (No Known Allergies)    Family History  Problem Relation Age of Onset  . Colon cancer Maternal Grandmother     History   Social History  . Marital Status: Married    Spouse Name: N/A  . Number of Children: N/A  . Years of Education: N/A   Occupational History  . [employer]    Social History Main Topics  . Smoking status: Never Smoker   . Smokeless tobacco: Not on file  . Alcohol Use: 0.0 oz/week    0 Standard drinks or equivalent per week     Comment: social drinker   . Drug Use: No  .  Sexual Activity: Not on file   Other Topics Concern  . None   Social History Narrative    Review of Systems: As mentioned in HPI  Physical Exam: BP 143/86 mmHg  Pulse 70  Temp(Src) 97.6 F (36.4 C) (Oral)  Ht 5\' 9"  (1.753 m)  Wt 198 lb 6.4 oz (89.994 kg)  BMI 29.29 kg/m2 General:   Alert and oriented. No distress noted. Pleasant and cooperative.  Head:  Normocephalic and atraumatic. Eyes:  Conjuctiva clear without scleral icterus. Mouth:  Oral mucosa pink and moist. Good dentition. No lesions. Abdomen:  +BS, soft, non-tender and non-distended. No rebound or guarding. No HSM or masses noted. Msk:  Symmetrical without gross deformities. Normal posture. Extremities:  Without  edema. Neurologic:  Alert and  oriented x4;  grossly normal neurologically. Psych:  Alert and cooperative. Normal mood and affect.

## 2014-07-27 NOTE — Assessment & Plan Note (Signed)
Likely secondary to hemorrhoids, large polyp removed. Next surveillance in 3 years.

## 2014-07-27 NOTE — Assessment & Plan Note (Signed)
Controlled on Protonix daily. Dysphagia resolved s/p dilation. Continue Protonix daily and return in 1 year or sooner as needed.

## 2014-07-27 NOTE — Patient Instructions (Signed)
Continue Protonix once daily.  We will see you back in 1 year!!  Have a great summer!

## 2014-07-31 ENCOUNTER — Ambulatory Visit: Payer: BC Managed Care – PPO | Admitting: Cardiology

## 2014-08-09 NOTE — Progress Notes (Signed)
cc'd to pcp 

## 2014-08-11 ENCOUNTER — Encounter: Payer: Self-pay | Admitting: Cardiology

## 2014-08-17 ENCOUNTER — Other Ambulatory Visit: Payer: Self-pay | Admitting: Cardiology

## 2014-08-18 ENCOUNTER — Other Ambulatory Visit: Payer: Self-pay

## 2014-08-18 MED ORDER — LOSARTAN POTASSIUM-HCTZ 100-12.5 MG PO TABS: 1.0000 | ORAL_TABLET | Freq: Every day | ORAL | Status: DC

## 2014-08-25 DIAGNOSIS — G5601 Carpal tunnel syndrome, right upper limb: Secondary | ICD-10-CM

## 2014-08-25 HISTORY — DX: Carpal tunnel syndrome, right upper limb: G56.01

## 2014-09-07 ENCOUNTER — Other Ambulatory Visit: Payer: Self-pay | Admitting: Orthopedic Surgery

## 2014-09-11 ENCOUNTER — Encounter (HOSPITAL_BASED_OUTPATIENT_CLINIC_OR_DEPARTMENT_OTHER)
Admission: RE | Admit: 2014-09-11 | Discharge: 2014-09-11 | Disposition: A | Discharge status: Home / Self Care | Payer: BC Managed Care – PPO | Source: Ambulatory Visit | Attending: Orthopedic Surgery | Admitting: Orthopedic Surgery

## 2014-09-11 ENCOUNTER — Encounter (HOSPITAL_BASED_OUTPATIENT_CLINIC_OR_DEPARTMENT_OTHER): Payer: Self-pay | Admitting: *Deleted

## 2014-09-11 DIAGNOSIS — M13812 Other specified arthritis, left shoulder: Secondary | ICD-10-CM | POA: Diagnosis not present

## 2014-09-11 DIAGNOSIS — I517 Cardiomegaly: Secondary | ICD-10-CM | POA: Diagnosis not present

## 2014-09-11 DIAGNOSIS — Z791 Long term (current) use of non-steroidal anti-inflammatories (NSAID): Secondary | ICD-10-CM | POA: Diagnosis not present

## 2014-09-11 DIAGNOSIS — M71331 Other bursal cyst, right wrist: Secondary | ICD-10-CM | POA: Diagnosis not present

## 2014-09-11 DIAGNOSIS — M13822 Other specified arthritis, left elbow: Secondary | ICD-10-CM | POA: Diagnosis not present

## 2014-09-11 DIAGNOSIS — G5601 Carpal tunnel syndrome, right upper limb: Secondary | ICD-10-CM | POA: Diagnosis not present

## 2014-09-11 DIAGNOSIS — M13821 Other specified arthritis, right elbow: Secondary | ICD-10-CM | POA: Diagnosis not present

## 2014-09-11 DIAGNOSIS — M13811 Other specified arthritis, right shoulder: Secondary | ICD-10-CM | POA: Diagnosis not present

## 2014-09-11 DIAGNOSIS — I10 Essential (primary) hypertension: Secondary | ICD-10-CM | POA: Diagnosis not present

## 2014-09-11 DIAGNOSIS — M13842 Other specified arthritis, left hand: Secondary | ICD-10-CM | POA: Diagnosis not present

## 2014-09-11 DIAGNOSIS — K219 Gastro-esophageal reflux disease without esophagitis: Secondary | ICD-10-CM | POA: Diagnosis not present

## 2014-09-11 DIAGNOSIS — M1388 Other specified arthritis, other site: Secondary | ICD-10-CM | POA: Diagnosis not present

## 2014-09-11 DIAGNOSIS — R9431 Abnormal electrocardiogram [ECG] [EKG]: Secondary | ICD-10-CM | POA: Diagnosis not present

## 2014-09-11 DIAGNOSIS — M13841 Other specified arthritis, right hand: Secondary | ICD-10-CM | POA: Diagnosis not present

## 2014-09-11 DIAGNOSIS — Z79899 Other long term (current) drug therapy: Secondary | ICD-10-CM | POA: Diagnosis not present

## 2014-09-11 LAB — BASIC METABOLIC PANEL
Anion gap: 9 (ref 5–15)
BUN: 14 mg/dL (ref 6–20)
CO2: 23 mmol/L (ref 22–32)
Calcium: 9.3 mg/dL (ref 8.9–10.3)
Chloride: 104 mmol/L (ref 101–111)
Creatinine, Ser: 1.27 mg/dL — ABNORMAL HIGH (ref 0.61–1.24)
GFR calc Af Amer: >60 mL/min (ref >60)
GFR calc non Af Amer: 60 mL/min — ABNORMAL LOW (ref >60)
Glucose, Bld: 94 mg/dL (ref 65–99)
Potassium: 4.8 mmol/L (ref 3.5–5.1)
Sodium: 136 mmol/L (ref 135–145)

## 2014-09-11 NOTE — Pre-Procedure Instructions (Signed)
To come for BMET and EKG 

## 2014-09-13 ENCOUNTER — Other Ambulatory Visit: Payer: Self-pay | Admitting: Cardiology

## 2014-09-14 ENCOUNTER — Ambulatory Visit (HOSPITAL_BASED_OUTPATIENT_CLINIC_OR_DEPARTMENT_OTHER): Payer: BC Managed Care – PPO | Admitting: Anesthesiology

## 2014-09-14 ENCOUNTER — Encounter (HOSPITAL_BASED_OUTPATIENT_CLINIC_OR_DEPARTMENT_OTHER): Payer: Self-pay | Admitting: Anesthesiology

## 2014-09-14 ENCOUNTER — Ambulatory Visit (HOSPITAL_BASED_OUTPATIENT_CLINIC_OR_DEPARTMENT_OTHER)
Admission: RE | Admit: 2014-09-14 | Discharge: 2014-09-14 | Disposition: A | Discharge status: Home / Self Care | Payer: BC Managed Care – PPO | Source: Ambulatory Visit | Attending: Orthopedic Surgery | Admitting: Orthopedic Surgery

## 2014-09-14 ENCOUNTER — Encounter (HOSPITAL_BASED_OUTPATIENT_CLINIC_OR_DEPARTMENT_OTHER)
Admission: RE | Disposition: A | Discharge status: Home / Self Care | Payer: Self-pay | Source: Ambulatory Visit | Attending: Orthopedic Surgery

## 2014-09-14 DIAGNOSIS — G5601 Carpal tunnel syndrome, right upper limb: Secondary | ICD-10-CM | POA: Diagnosis not present

## 2014-09-14 DIAGNOSIS — M13842 Other specified arthritis, left hand: Secondary | ICD-10-CM | POA: Insufficient documentation

## 2014-09-14 DIAGNOSIS — M13821 Other specified arthritis, right elbow: Secondary | ICD-10-CM | POA: Insufficient documentation

## 2014-09-14 DIAGNOSIS — K219 Gastro-esophageal reflux disease without esophagitis: Secondary | ICD-10-CM | POA: Insufficient documentation

## 2014-09-14 DIAGNOSIS — I517 Cardiomegaly: Secondary | ICD-10-CM | POA: Insufficient documentation

## 2014-09-14 DIAGNOSIS — I10 Essential (primary) hypertension: Secondary | ICD-10-CM | POA: Insufficient documentation

## 2014-09-14 DIAGNOSIS — Z79899 Other long term (current) drug therapy: Secondary | ICD-10-CM | POA: Insufficient documentation

## 2014-09-14 DIAGNOSIS — R9431 Abnormal electrocardiogram [ECG] [EKG]: Secondary | ICD-10-CM | POA: Insufficient documentation

## 2014-09-14 DIAGNOSIS — M13841 Other specified arthritis, right hand: Secondary | ICD-10-CM | POA: Insufficient documentation

## 2014-09-14 DIAGNOSIS — M1388 Other specified arthritis, other site: Secondary | ICD-10-CM | POA: Insufficient documentation

## 2014-09-14 DIAGNOSIS — M13822 Other specified arthritis, left elbow: Secondary | ICD-10-CM | POA: Insufficient documentation

## 2014-09-14 DIAGNOSIS — Z791 Long term (current) use of non-steroidal anti-inflammatories (NSAID): Secondary | ICD-10-CM | POA: Insufficient documentation

## 2014-09-14 DIAGNOSIS — M13812 Other specified arthritis, left shoulder: Secondary | ICD-10-CM | POA: Insufficient documentation

## 2014-09-14 DIAGNOSIS — M71331 Other bursal cyst, right wrist: Secondary | ICD-10-CM | POA: Insufficient documentation

## 2014-09-14 DIAGNOSIS — M13811 Other specified arthritis, right shoulder: Secondary | ICD-10-CM | POA: Insufficient documentation

## 2014-09-14 HISTORY — PX: CARPAL TUNNEL RELEASE: SHX101

## 2014-09-14 HISTORY — DX: Unspecified osteoarthritis, unspecified site: M19.90

## 2014-09-14 HISTORY — PX: GANGLION CYST EXCISION: SHX1691

## 2014-09-14 HISTORY — DX: Carpal tunnel syndrome, right upper limb: G56.01

## 2014-09-14 HISTORY — DX: Dental restoration status: Z98.811

## 2014-09-14 LAB — POCT HEMOGLOBIN-HEMACUE: Hemoglobin: 15.4 g/dL (ref 13.0–17.0)

## 2014-09-14 SURGERY — CARPAL TUNNEL RELEASE
Anesthesia: Regional | Site: Wrist | Laterality: Right

## 2014-09-14 MED ORDER — PROPOFOL INFUSION 10 MG/ML OPTIME
INTRAVENOUS | Status: DC | PRN
  Administered 2014-09-14: 100 ug/kg/min via INTRAVENOUS

## 2014-09-14 MED ORDER — SCOPOLAMINE 1 MG/3DAYS TD PT72: 1.0000 | MEDICATED_PATCH | Freq: Once | TRANSDERMAL | Status: DC | PRN

## 2014-09-14 MED ORDER — GLYCOPYRROLATE 0.2 MG/ML IJ SOLN
0.2000 mg | Freq: Once | INTRAMUSCULAR | Status: DC | PRN
Start: 2014-09-14 — End: 2014-09-14

## 2014-09-14 MED ORDER — FENTANYL CITRATE (PF) 100 MCG/2ML IJ SOLN: 25.0000 ug | INTRAMUSCULAR | Status: DC | PRN

## 2014-09-14 MED ORDER — LIDOCAINE HCL (PF) 0.5 % IJ SOLN
INTRAMUSCULAR | Status: DC | PRN
  Administered 2014-09-14: 50 mL via INTRAVENOUS

## 2014-09-14 MED ORDER — CEFAZOLIN SODIUM-DEXTROSE 2-3 GM-% IV SOLR
2.0000 g | INTRAVENOUS | Status: AC
  Administered 2014-09-14: 2 g via INTRAVENOUS

## 2014-09-14 MED ORDER — BUPIVACAINE HCL (PF) 0.25 % IJ SOLN
INTRAMUSCULAR | Status: DC | PRN
  Administered 2014-09-14: 8.5 mL

## 2014-09-14 MED ORDER — MIDAZOLAM HCL 2 MG/2ML IJ SOLN
INTRAMUSCULAR | Status: AC
  Filled 2014-09-14: qty 2

## 2014-09-14 MED ORDER — FENTANYL CITRATE (PF) 100 MCG/2ML IJ SOLN
INTRAMUSCULAR | Status: AC
  Filled 2014-09-14: qty 4

## 2014-09-14 MED ORDER — FENTANYL CITRATE (PF) 100 MCG/2ML IJ SOLN
INTRAMUSCULAR | Status: AC
  Filled 2014-09-14: qty 2

## 2014-09-14 MED ORDER — ONDANSETRON HCL 4 MG/2ML IJ SOLN
INTRAMUSCULAR | Status: DC | PRN
  Administered 2014-09-14: 4 mg via INTRAVENOUS

## 2014-09-14 MED ORDER — DEXAMETHASONE SODIUM PHOSPHATE 10 MG/ML IJ SOLN
INTRAMUSCULAR | Status: DC | PRN
  Administered 2014-09-14: 10 mg via INTRAVENOUS

## 2014-09-14 MED ORDER — LIDOCAINE HCL (CARDIAC) 20 MG/ML IV SOLN
INTRAVENOUS | Status: DC | PRN
  Administered 2014-09-14: 25 mg via INTRAVENOUS

## 2014-09-14 MED ORDER — FENTANYL CITRATE (PF) 100 MCG/2ML IJ SOLN
50.0000 ug | INTRAMUSCULAR | Status: DC | PRN
  Administered 2014-09-14 (×2): 50 ug via INTRAVENOUS

## 2014-09-14 MED ORDER — KETOROLAC TROMETHAMINE 30 MG/ML IJ SOLN: 30.0000 mg | Freq: Once | INTRAMUSCULAR | Status: DC | PRN

## 2014-09-14 MED ORDER — LACTATED RINGERS IV SOLN
INTRAVENOUS | Status: DC
  Administered 2014-09-14: 10:00:00 via INTRAVENOUS

## 2014-09-14 MED ORDER — MIDAZOLAM HCL 2 MG/2ML IJ SOLN
1.0000 mg | INTRAMUSCULAR | Status: DC | PRN
  Administered 2014-09-14 (×2): 1 mg via INTRAVENOUS

## 2014-09-14 MED ORDER — CHLORHEXIDINE GLUCONATE 4 % EX LIQD: 60.0000 mL | Freq: Once | CUTANEOUS | Status: DC

## 2014-09-14 MED ORDER — PROMETHAZINE HCL 25 MG/ML IJ SOLN: 6.2500 mg | INTRAMUSCULAR | Status: DC | PRN

## 2014-09-14 MED ORDER — OXYCODONE-ACETAMINOPHEN 5-325 MG PO TABS: ORAL_TABLET | ORAL | Status: DC

## 2014-09-14 SURGICAL SUPPLY — 34 items
BANDAGE ELASTIC 3 VELCRO ST LF (GAUZE/BANDAGES/DRESSINGS) ×4 IMPLANT
BLADE MINI RND TIP GREEN BEAV (BLADE) IMPLANT
BLADE SURG 15 STRL LF DISP TIS (BLADE) ×4 IMPLANT
BLADE SURG 15 STRL SS (BLADE) ×4
BNDG ESMARK 4X9 LF (GAUZE/BANDAGES/DRESSINGS) IMPLANT
BNDG GAUZE ELAST 4 BULKY (GAUZE/BANDAGES/DRESSINGS) ×4 IMPLANT
CHLORAPREP W/TINT 26ML (MISCELLANEOUS) ×4 IMPLANT
CORDS BIPOLAR (ELECTRODE) ×4 IMPLANT
COVER BACK TABLE 60X90IN (DRAPES) ×4 IMPLANT
COVER MAYO STAND STRL (DRAPES) ×4 IMPLANT
CUFF TOURNIQUET SINGLE 18IN (TOURNIQUET CUFF) ×4 IMPLANT
DRAPE EXTREMITY T 121X128X90 (DRAPE) ×4 IMPLANT
DRAPE SURG 17X23 STRL (DRAPES) ×4 IMPLANT
DRSG PAD ABDOMINAL 8X10 ST (GAUZE/BANDAGES/DRESSINGS) ×4 IMPLANT
GAUZE SPONGE 4X4 12PLY STRL (GAUZE/BANDAGES/DRESSINGS) ×4 IMPLANT
GAUZE XEROFORM 1X8 LF (GAUZE/BANDAGES/DRESSINGS) ×4 IMPLANT
GLOVE BIO SURGEON STRL SZ7.5 (GLOVE) ×4 IMPLANT
GLOVE BIOGEL PI IND STRL 8 (GLOVE) ×2 IMPLANT
GLOVE BIOGEL PI INDICATOR 8 (GLOVE) ×2
GOWN STRL REUS W/ TWL LRG LVL3 (GOWN DISPOSABLE) ×2 IMPLANT
GOWN STRL REUS W/TWL LRG LVL3 (GOWN DISPOSABLE) ×2
GOWN STRL REUS W/TWL XL LVL3 (GOWN DISPOSABLE) ×4 IMPLANT
NEEDLE HYPO 25X1 1.5 SAFETY (NEEDLE) ×4 IMPLANT
NS IRRIG 1000ML POUR BTL (IV SOLUTION) ×4 IMPLANT
PACK BASIN DAY SURGERY FS (CUSTOM PROCEDURE TRAY) ×4 IMPLANT
PADDING CAST ABS 4INX4YD NS (CAST SUPPLIES) ×2
PADDING CAST ABS COTTON 4X4 ST (CAST SUPPLIES) ×2 IMPLANT
STOCKINETTE 4X48 STRL (DRAPES) ×4 IMPLANT
SUT ETHILON 4 0 PS 2 18 (SUTURE) ×4 IMPLANT
SUT VIC AB 4-0 P2 18 (SUTURE) ×8 IMPLANT
SYR BULB 3OZ (MISCELLANEOUS) ×4 IMPLANT
SYR CONTROL 10ML LL (SYRINGE) ×4 IMPLANT
TOWEL OR 17X24 6PK STRL BLUE (TOWEL DISPOSABLE) ×4 IMPLANT
UNDERPAD 30X30 (UNDERPADS AND DIAPERS) ×4 IMPLANT

## 2014-09-14 NOTE — Transfer of Care (Signed)
Immediate Anesthesia Transfer of Care Note  Patient: Derek Wilkerson  Procedure(s) Performed: Procedure(s): RIGHT CARPAL TUNNEL RELEASE (Right) REMOVAL GANGLION CYST WRIST (Right)  Patient Location: PACU  Anesthesia Type:MAC and Bier block  Level of Consciousness: awake and alert   Airway & Oxygen Therapy: Patient Spontanous Breathing and Patient connected to face mask oxygen  Post-op Assessment: Report given to RN and Post -op Vital signs reviewed and stable  Post vital signs: Reviewed and stable  Last Vitals:  Filed Vitals:   09/14/14 0856  BP: 148/91  Pulse: 80  Temp: 36.8 C  Resp: 18    Complications: No apparent anesthesia complications

## 2014-09-14 NOTE — H&P (Signed)
Derek Wilkerson is an 59 y.o. male.   Chief Complaint: right carpal tunnel and ganglion cyst HPI: 59 yo rhd male with 5 months burning sensation in right hand.  Nocturnal symptoms 2-3 times per week that wake him and cause him to shake his hand to gain relief.  Positive nerve conduction studies.  Also notes a cyst on volar right wrist that he wishes to have excised.  Past Medical History  Diagnosis Date  . GERD (gastroesophageal reflux disease)   . Arthritis     neck, shoulders, elbows, hands  . Hypertension     states under control with med., has been on med. since age 15  . Carpal tunnel syndrome of right wrist 08/2014  . Dental crown present     Past Surgical History  Procedure Laterality Date  . Colonoscopy    . Shoulder arthroscopy w/ rotator cuff repair Right 2001  . Nasal sinus surgery    . Colonoscopy N/A 03/31/2014    Dr. Oneida Wilkerson: One large sigmoid polyp removed. One small transverse colon polyp removed. Moderate internal hemorrhoids. path with sessile serrated polyp and leiomyoma of transverse colon, sigmoid tubular adenoma. Surveillance 2019  . Esophagogastroduodenoscopy N/A 03/31/2014    Dr. Oneida Wilkerson: 1. Stricture at the gastroesophageal junction 2. mild non-erosive gastritis and duodentitis 3. moderate hiatal hernia  . Savory dilation N/A 03/31/2014    Procedure: SAVORY DILATION;  Surgeon: Derek Binder, MD;  [facility]: [facility];  Service: Endoscopy;  Laterality: N/A;  Derek Wilkerson dilation N/A 03/31/2014    Procedure: Derek Wilkerson DILATION;  Surgeon: Derek Binder, MD;  [facility]: [facility];  Service: Endoscopy;  Laterality: N/A;  . Lasik Bilateral 1999    Family History  Problem Relation Age of Onset  . Colon cancer Maternal Grandmother   . Heart attack Mother    Social History:  reports that he has never smoked. He has never used smokeless tobacco. He reports that he drinks alcohol. He reports that he does not use illicit drugs.  Allergies: No Known Allergies  Medications  Prior to Admission  Medication Sig Dispense Refill  . glucosamine-chondroitin 500-400 MG tablet Take 1 tablet by mouth daily.     Marland Kitchen KRILL OIL PO Take by mouth.    . losartan-hydrochlorothiazide (HYZAAR) 100-12.5 MG per tablet TAKE 1 TABLET BY MOUTH EVERY DAY**PT NEEDS TO CALL AND SETUP APPT FOR FURTHER REFILLS [phone]** 30 tablet 0  . meloxicam (MOBIC) 15 MG tablet Take 15 mg by mouth daily.    Marland Kitchen OVER THE COUNTER MEDICATION Take 1 capsule by mouth daily. Super Beta Prostate    . pantoprazole (PROTONIX) 40 MG tablet Take 1 tablet (40 mg total) by mouth 2 (two) times daily before a meal. 60 tablet 11  . sertraline (ZOLOFT) 100 MG tablet Take 150 mg by mouth daily.  2  . Testosterone 20.25 MG/ACT (1.62%) GEL Place onto the skin daily.      No results found for this or any previous visit (from the past 48 hour(s)).  No results found.   A comprehensive review of systems was negative except for: Ears, nose, mouth, throat, and face: positive for hearing loss and tinnitus Gastrointestinal: positive for melena Musculoskeletal: positive for arthralgias and back pain  Blood pressure 148/91, pulse 80, temperature 98.2 F (36.8 C), temperature source Oral, resp. rate 18, height 5\' 8"  (1.727 m), weight 88.451 kg (195 lb), SpO2 100 %.  General appearance: alert, cooperative and appears stated age Head: Normocephalic, without obvious abnormality,  atraumatic Neck: supple, symmetrical, trachea midline Resp: clear to auscultation bilaterally Cardio: regular rate and rhythm GI: non tender Extremities: intact sensation and capillary refill all digits.  +epl/fpl/io.  no wounds.  mass volar radial wrist.   Pulses: 2+ and symmetric Skin: Skin color, texture, turgor normal. No rashes or lesions Neurologic: Grossly normal Incision/Wound: none  Assessment/Plan Right carpal tunnel syndrome and volar ganglion cyst.  Non operative and operative treatment options were discussed with the patient and  patient wishes to proceed with operative treatment. Risks, benefits, and alternatives of surgery were discussed and the patient agrees with the plan of care.   Derek Wilkerson R 09/14/2014, 9:43 AM

## 2014-09-14 NOTE — Op Note (Signed)
NAMEELRIDGE, STEMM                  ACCOUNT NO.:  [id number]  MEDICAL RECORD NO.:  [id number]  LOCATION:                               FACILITY:  [facility]  PHYSICIAN:  Leanora Cover, MD        DATE OF BIRTH:  1955-11-28  DATE OF PROCEDURE:  09/14/2014 DATE OF DISCHARGE:  09/14/2014                              OPERATIVE REPORT   PREOPERATIVE DIAGNOSIS:  Right carpal tunnel syndrome and volar ganglion cyst.  POSTOPERATIVE DIAGNOSIS:  Right carpal tunnel syndrome and volar ganglion cyst.  PROCEDURE:   1. Right carpal tunnel release 2. Right wrist volar mass excision via separate incision  SURGEON:  Leanora Cover, MD  ASSISTANT:  Daryll Brod, M.D.  ANESTHESIA:  Bier block with sedation.  IV FLUIDS:  Per anesthesia flow sheet.  ESTIMATED BLOOD LOSS:  Minimal.  COMPLICATIONS:  None.  SPECIMENS:  Mass to Pathology.  TOURNIQUET TIME:  39 minutes.  DISPOSITION:  Stable to PACU.  INDICATIONS:  Mr. Lerette is a 59 year old male who has had burning sensation in the digits for some time.  He has positive nerve conduction studies.  He is woken up at night by this.  He wishes to have a carpal tunnel release.  He also noted a mass at the volar aspect of the wrist that he would like excised.  Risks, benefits, and alternatives of surgery were discussed including the risk of blood loss; infection; damage to nerves, vessels, tendons, ligaments, bone; failure of surgery; need for additional surgery; complications with wound healing, continued pain, recurrence of mass, recurrence of carpal tunnel syndrome, as well as damage to motor branch.  He voiced understanding of these risks and elected to proceed.  OPERATIVE COURSE:  After being identified preoperatively by myself, the patient and I agreed upon procedure and site of procedure.  Surgical site was marked.  The risks, benefits, and alternatives of surgery were reviewed and he wished to proceed.  Surgical consent had been signed. He was given  IV Ancef as preoperative antibiotic prophylaxis.  He was transported to the operating room and placed on the operating room table in a supine position with the right upper extremity on arm board.  Bier block anesthesia was induced by the anesthesiologist.  The right upper extremity was prepped and draped in normal sterile orthopedic fashion. Surgical pause was performed between surgeons, anesthesia, and operating room staff, and all were in agreement as to the patient, procedure, and site of procedure.  Tourniquet at the proximal aspect of the forearm had been inflated after Bier block.  Incision was made over the transverse carpal ligament and carried to subcutaneous tissues by spreading technique.  Bipolar electrocautery was used to obtain hemostasis.  The palmar fascia sharply incised.  Transverse carpal ligament was identified and incised sharply.  It was incised distally first.  Care was taken to ensure complete decompression distally.  It was then incised proximally.  Scissors were used to split the distal aspect of the volar antebrachial fascia.  A finger was placed into the wound to ensure complete decompression, which was the case.  The nerve was inspected.  It was flattened, hyperemic, and irritated.  The motor branch was identified and was intact.  The wound was copiously irrigated.  It was later closed with 4-0 nylon in a horizontal mattress fashion.  Attention was turned to the mass.  A zigzag incision was made at the radial side of the wrist over the mass.  This was carried into subcutaneous tissues by spreading technique.  Bipolar electrocautery was again used to obtain hemostasis.  The fascia was sharply incised. Radial artery was identified and protected throughout the case.  The mass was identified.  It was clear of soft tissue attachments.  The stalk was traced down to the joint.  The mass was removed and sent to Pathology for examination.  A 4-0 Vicryl suture was used  in a figure-of- eight fashion to close the rent in the capsule.  The wound was copiously irrigated with sterile saline and closed with 4-0 nylon in a horizontal mattress fashion.  Both wounds were injected with 8.5 mL of 0.25% plain Marcaine to aid in postoperative analgesia.  The wounds were then dressed with sterile Xeroform, 4x4s, and ABD and wrapped with Kerlix and Ace bandage.  Tourniquet was deflated at 39 minutes.  The fingertips were pink with brisk capillary refill after deflation of the tourniquet. Operative drapes were broken down and the patient was awoken from anesthesia safely.  He was transferred back to the stretcher and taken to PACU in stable condition.  I will see him back in the office in 1 week for postoperative followup.  I will give him Percocet 5/325, one to two p.o. q.6 hours p.r.n. pain, dispensed #30.     Leanora Cover, MD   ______________________________ Leanora Cover, MD    KK/MEDQ  D:  09/14/2014  T:  09/14/2014  Job:  [id number]

## 2014-09-14 NOTE — Op Note (Signed)
[id number] 

## 2014-09-14 NOTE — Discharge Instructions (Addendum)

## 2014-09-14 NOTE — Anesthesia Preprocedure Evaluation (Signed)
Anesthesia Evaluation  Patient identified by MRN, date of birth, ID band Patient awake    Reviewed: Allergy & Precautions, NPO status , Patient's Chart, lab work & pertinent test results  Airway Mallampati: II  TM Distance: >3 FB Neck ROM: Full    Dental no notable dental hx.    Pulmonary neg pulmonary ROS,  breath sounds clear to auscultation  Pulmonary exam normal       Cardiovascular hypertension, Pt. on medications Normal cardiovascular examRhythm:Regular Rate:Normal     Neuro/Psych negative neurological ROS  negative psych ROS   GI/Hepatic Neg liver ROS, GERD-  ,  Endo/Other  negative endocrine ROS  Renal/GU negative Renal ROS  negative genitourinary   Musculoskeletal negative musculoskeletal ROS (+)   Abdominal   Peds negative pediatric ROS (+)  Hematology negative hematology ROS (+)   Anesthesia Other Findings   Reproductive/Obstetrics negative OB ROS                             Anesthesia Physical Anesthesia Plan  ASA: II  Anesthesia Plan: Bier Block   Post-op Pain Management:    Induction: Intravenous  Airway Management Planned: Simple Face Mask  Additional Equipment:   Intra-op Plan:   Post-operative Plan:   Informed Consent: I have reviewed the patients History and Physical, chart, labs and discussed the procedure including the risks, benefits and alternatives for the proposed anesthesia with the patient or authorized representative who has indicated his/her understanding and acceptance.   Dental advisory given  Plan Discussed with: CRNA and Surgeon  Anesthesia Plan Comments:         Anesthesia Quick Evaluation

## 2014-09-14 NOTE — Brief Op Note (Signed)
09/14/2014  11:00 AM  PATIENT:  Derek Wilkerson  59 y.o. male  PRE-OPERATIVE DIAGNOSIS:  RIGHT CARPAL TUNNEL SYNDROME, GANGLION CYST RIGHT WRIST.  POST-OPERATIVE DIAGNOSIS:  SYNDROME, GANGLION CYST RIGHT WRIST.  PROCEDURE:  Procedure(s): RIGHT CARPAL TUNNEL RELEASE (Right) REMOVAL GANGLION CYST WRIST (Right)  SURGEON:  Surgeon(s) and Role:    * Leanora Cover, MD - Primary    * Daryll Brod, MD - Assisting  PHYSICIAN ASSISTANT:   ASSISTANTS: Daryll Brod, MD   ANESTHESIA:   Bier block with sedation  EBL:  Total I/O In: 800 [I.V.:800] Out: -   BLOOD ADMINISTERED:none  DRAINS: none   LOCAL MEDICATIONS USED:  MARCAINE     SPECIMEN:  Source of Specimen:  right wrist  DISPOSITION OF SPECIMEN:  PATHOLOGY  COUNTS:  YES  TOURNIQUET:   Total Tourniquet Time Documented: Upper Arm (Right) - 39 minutes Total: Upper Arm (Right) - 39 minutes   DICTATION: .Other Dictation: Dictation Number [id number]  PLAN OF CARE: Discharge to home after PACU  PATIENT DISPOSITION:  PACU - hemodynamically stable.

## 2014-09-14 NOTE — Anesthesia Procedure Notes (Signed)
Procedure Name: MAC Date/Time: 09/14/2014 10:11 AM Performed by: Baxter Flattery Pre-anesthesia Checklist: Patient identified, Emergency Drugs available, Suction available, Patient being monitored and Timeout performed Patient Re-evaluated:Patient Re-evaluated prior to inductionOxygen Delivery Method: Simple face mask Preoxygenation: Pre-oxygenation with 100% oxygen Intubation Type: IV induction Dental Injury: Teeth and Oropharynx as per pre-operative assessment

## 2014-09-14 NOTE — Anesthesia Postprocedure Evaluation (Signed)
  Anesthesia Post-op Note  Patient: Derek Wilkerson  Procedure(s) Performed: Procedure(s) (LRB): RIGHT CARPAL TUNNEL RELEASE (Right) REMOVAL GANGLION CYST WRIST (Right)  Patient Location: PACU  Anesthesia Type: Bier block  Level of Consciousness: awake and alert   Airway and Oxygen Therapy: Patient Spontanous Breathing  Post-op Pain: mild  Post-op Assessment: Post-op Vital signs reviewed, Patient's Cardiovascular Status Stable, Respiratory Function Stable, Patent Airway and No signs of Nausea or vomiting  Last Vitals:  Filed Vitals:   09/14/14 1130  BP: 143/100  Pulse: 88  Temp:   Resp: 20    Post-op Vital Signs: stable   Complications: No apparent anesthesia complications

## 2014-09-15 ENCOUNTER — Encounter (HOSPITAL_BASED_OUTPATIENT_CLINIC_OR_DEPARTMENT_OTHER): Payer: Self-pay | Admitting: Orthopedic Surgery

## 2014-10-14 ENCOUNTER — Other Ambulatory Visit: Payer: Self-pay | Admitting: Cardiology

## 2014-10-16 NOTE — Telephone Encounter (Signed)
Please advise on refill. Patient has not been seen in this office and has had multiple warnings to schedule an appointment. Thanks, MI

## 2014-10-31 ENCOUNTER — Other Ambulatory Visit: Payer: Self-pay | Admitting: Cardiology

## 2014-12-31 ENCOUNTER — Other Ambulatory Visit: Payer: Self-pay | Admitting: Cardiology

## 2015-01-27 ENCOUNTER — Other Ambulatory Visit: Payer: Self-pay | Admitting: Cardiology

## 2015-02-04 ENCOUNTER — Other Ambulatory Visit: Payer: Self-pay | Admitting: Cardiology

## 2015-03-31 ENCOUNTER — Other Ambulatory Visit: Payer: Self-pay | Admitting: Gastroenterology

## 2015-04-02 ENCOUNTER — Encounter: Payer: Self-pay | Admitting: Cardiology

## 2015-04-02 ENCOUNTER — Ambulatory Visit (INDEPENDENT_AMBULATORY_CARE_PROVIDER_SITE_OTHER): Payer: BC Managed Care – PPO | Admitting: Cardiology

## 2015-04-02 VITALS — BP 150/100 | HR 82 | Ht 68.0 in | Wt 195.1 lb

## 2015-04-02 DIAGNOSIS — I10 Essential (primary) hypertension: Secondary | ICD-10-CM

## 2015-04-02 DIAGNOSIS — E785 Hyperlipidemia, unspecified: Secondary | ICD-10-CM | POA: Diagnosis not present

## 2015-04-02 DIAGNOSIS — R0609 Other forms of dyspnea: Secondary | ICD-10-CM | POA: Diagnosis not present

## 2015-04-02 DIAGNOSIS — E7849 Other hyperlipidemia: Secondary | ICD-10-CM

## 2015-04-02 DIAGNOSIS — Z8249 Family history of ischemic heart disease and other diseases of the circulatory system: Secondary | ICD-10-CM | POA: Insufficient documentation

## 2015-04-02 LAB — LIPID PANEL
Cholesterol: 225 mg/dL — ABNORMAL HIGH (ref 125–200)
HDL: 32 mg/dL — AB (ref >=40)
LDL Cholesterol: 169 mg/dL — ABNORMAL HIGH (ref <130)
Total CHOL/HDL Ratio: 7 Ratio — ABNORMAL HIGH (ref <=5.0)
Triglycerides: 119 mg/dL (ref <150)
VLDL: 24 mg/dL (ref <30)

## 2015-04-02 NOTE — Addendum Note (Signed)
Addended by: Eulis Foster on: 04/02/2015 09:25 AM   Modules accepted: Orders

## 2015-04-02 NOTE — Progress Notes (Signed)
Cardiology Office Note    Date:  04/02/2015   ID:  Derek Wilkerson, DOB [birth year], MRN [id number]  PCP:  Monico Blitz, MD  Cardiologist:   Candee Furbish, MD     History of Present Illness:  Derek Wilkerson is a 60 y.o. male  Who last saw me in 2013 here for evaluation family history of ischemic heart disease, hypertension, hyperlipidemia.   In review of notes from 07/23/11, he had familial hyperlipidemia with original LDL of 225. Had a nuclear stress test performed in January 2009 which was low risk, 7 minutes and 12 seconds exercise. He also has a family history of sudden cardiac death with his mother dying in her 40s. 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   Has more SOB than usual. Comes and goes. Works out. Chest tight at times which is normal for him. Wife noted increase WOB with walk.   His LDL cholesterol was down to 64 from 225 after taking Vytorin. Leg pain.   EKG from 12/26/ 2008 shows sinus rhythm, 90 , nonspecific ST-T wave changes.   Echocardiogram from 03/01/2007 shows normal EF 65-70% with no other abnormalities.   He was seen on 02/12/15 for hypertension Arsenio Katz, NP. Hemoglobin was 15.2 , potassium was 4.2 , creatinine was 1.2 , total cholesterol 234 , triglycerides 123 , LDL perhaps 38   Past Medical History  Diagnosis Date  . GERD (gastroesophageal reflux disease)   . Arthritis     neck, shoulders, elbows, hands  . Hypertension     states under control with med., has been on med. since age 45  . Carpal tunnel syndrome of right wrist 08/2014  . Dental crown present     Past Surgical History  Procedure Laterality Date  . Colonoscopy    . Shoulder arthroscopy w/ rotator cuff repair Right 2001  . Nasal sinus surgery    . Colonoscopy N/A 03/31/2014    Dr. Oneida Alar: One large sigmoid polyp removed. One small transverse colon polyp removed. Moderate internal hemorrhoids. path with sessile serrated polyp and leiomyoma of  transverse colon, sigmoid tubular adenoma. Surveillance 2019  . Esophagogastroduodenoscopy N/A 03/31/2014    Dr. Oneida Alar: 1. Stricture at the gastroesophageal junction 2. mild non-erosive gastritis and duodentitis 3. moderate hiatal hernia  . Savory dilation N/A 03/31/2014    Procedure: SAVORY DILATION;  Surgeon: [name], MD;  [facility];  Service: Endoscopy;  Laterality: N/A;  [name] dilation N/A 03/31/2014    Procedure: [name] DILATION;  Surgeon: [name], MD;  Location: [facility];  Service: Endoscopy;  Laterality: N/A;  . Lasik Bilateral 1999  . Carpal tunnel release Right 09/14/2014    Procedure: RIGHT CARPAL TUNNEL RELEASE;  Surgeon: Leanora Cover, MD;  Location: [place];  Service: Orthopedics;  Laterality: Right;  . Ganglion cyst excision Right 09/14/2014    Procedure: REMOVAL GANGLION CYST WRIST;  Surgeon: Leanora Cover, MD;  Location: [place];  Service: Orthopedics;  Laterality: Right;    Outpatient Prescriptions Prior to Visit  Medication Sig Dispense Refill  . KRILL OIL PO Take by mouth.    . meloxicam (MOBIC) 15 MG tablet Take 15 mg by mouth daily.    . pantoprazole (PROTONIX) 40 MG tablet Take 1 tablet (40 mg total) by mouth 2 (two) times daily before a meal. 60 tablet 11  . sertraline (ZOLOFT) 100 MG tablet Take 150 mg by mouth daily.  2  . Testosterone 20.25 MG/ACT (1.62%) GEL Place onto the skin daily.    Marland Kitchen glucosamine-chondroitin 500-400 MG tablet Take 1 tablet by mouth daily. Reported on 04/02/2015    . losartan-hydrochlorothiazide (HYZAAR) 100-12.5 MG tablet TAKE 1 TABLET BY MOUTH EVERY DAY (Patient not taking: Reported on 04/02/2015) 30 tablet 0  . OVER THE COUNTER MEDICATION Take 1 capsule by mouth daily. Reported on 04/02/2015    . oxyCODONE-acetaminophen (PERCOCET) 5-325 MG per tablet 1-2 tabs po q6 hours prn pain (Patient not taking: Reported on 04/02/2015) 30 tablet 0   No facility-administered medications prior to  visit.     Allergies:   Vytorin   Social History   Social History  . Marital Status: Married    Spouse Name: N/A  . Number of Children: N/A  . Years of Education: N/A   Occupational History  . [employer]    Social History Main Topics  . Smoking status: Never Smoker   . Smokeless tobacco: Never Used  . Alcohol Use: 0.0 oz/week    0 Standard drinks or equivalent per week     Comment: occasionally  . Drug Use: No  . Sexual Activity: Not Asked   Other Topics Concern  . None   Social History Narrative     Family History:  The patient's family history includes Colon cancer in his maternal grandmother; Heart attack in his mother.   ROS:   Please see the history of present illness.    ROS  Has some hearing loss, shortness of breath with activity All other systems reviewed and are negative.   PHYSICAL EXAM:   VS:  BP 150/100 mmHg  Pulse 82  Ht 5\' 8"  (1.727 m)  Wt 195 lb 1.9 oz (88.506 kg)  BMI 29.67 kg/m2   GEN: Well nourished, well developed, in no acute distress HEENT: normal Neck: no JVD, carotid bruits, or masses Cardiac: RRR; no murmurs, rubs, or gallops,no edema  Respiratory:  clear to auscultation bilaterally, normal work of breathing GI: soft, nontender, nondistended, + BS MS: no deformity or atrophy Skin: warm and dry, no rash Neuro:  Alert and Oriented x 3, Strength and sensation are intact Psych: euthymic mood, full affect  Wt Readings from Last 3 Encounters:  04/02/15 195 lb 1.9 oz (88.506 kg)  09/14/14 195 lb (88.451 kg)  07/27/14 198 lb 6.4 oz (89.994 kg)      Studies/Labs Reviewed:   EKG:  EKG is ordered today.  The ekg ordered today demonstrates  04/02/15-sinus rhythm, nonspecific ST-T wave changes , no significant change from prior. Personally viewed.  Recent Labs: 09/11/2014: BUN 14; Creatinine, Ser 1.27*; Potassium 4.8; Sodium 136 09/14/2014: Hemoglobin 15.4   Lipid Panel No results found for: CHOL, TRIG, HDL, CHOLHDL, VLDL,  LDLCALC, LDLDIRECT  Additional studies/ records that were reviewed today include:   prior medical records, lab work , stress test, echocardiogram reviewed    ASSESSMENT:    1. Dyspnea on exertion   2. Familial hyperlipidemia   3. Essential hypertension   4. Family history of early CAD      PLAN:  In order of problems listed above:  1.  familial hyperlipidemia -baseline LDL 225, responded excellently to Vytorin 10/40 in the past  However he stopped this medication secondary to leg pain. I would like for him to visit with the lipid clinic here to discuss further options , trial other statins for instance. Checking lipid profile today. 2.  Family history of CAD-mother with heart  attack. Continue with aggressive primary prevention. 3.  Essential hypertension- continue with combination therapy losartan hydrochlorothiazide. Elevated today in clinic. Previously normal at primary care visit. 4.  Dyspnea on exertion- recently when walking with his wife, noted excessive shortness of breath when compared to her. He lives weights regularly. Sometimes has chest tightness as a result of muscular strain during workouts. With his dyspnea , other risk factors noted above, we will go ahead and check a nuclear stress test. He had one in 2009 that was low risk. If his blood pressure spikes, elevation noted today in clinic, this can also cause dyspnea on exertion. He is working with his primary physician for hypertension control.    Medication Adjustments/Labs and Tests Ordered: Current medicines are reviewed at length with the patient today.  Concerns regarding medicines are outlined above.  Medication changes, Labs and Tests ordered today are listed in the Patient Instructions below. Patient Instructions  Medication Instructions:  The current medical regimen is effective;  continue present plan and medications.  Labwork: Please have blood work today (Lipid)  Testing/Procedures: Your physician has  requested that you have a myoview. For further information please visit [email or web address]. Please follow instruction sheet, as given.  You have been referred to be evaluated by the Lipid Clinic.  Follow-Up: Follow up after testing and being evaluated by the Lipid Clinic.  If you need a refill on your cardiac medications before your next appointment, please call your pharmacy.  Thank you for choosing [facility]!!            Signed, Candee Furbish, MD  04/02/2015 9:13 AM    [facility] [place], [place], [place]  [address] Phone: [phone]; Fax: [phone]

## 2015-04-02 NOTE — Patient Instructions (Addendum)
Medication Instructions:  The current medical regimen is effective;  continue present plan and medications.  Labwork: Please have blood work today (Lipid)  Testing/Procedures: Your physician has requested that you have a myoview. For further information please visit [email or web address]. Please follow instruction sheet, as given.  You have been referred to be evaluated by the Lipid Clinic.  Follow-Up: Follow up after testing and being evaluated by the Lipid Clinic.  If you need a refill on your cardiac medications before your next appointment, please call your pharmacy.  Thank you for choosing [facility]!!

## 2015-04-11 ENCOUNTER — Telehealth (HOSPITAL_COMMUNITY): Payer: Self-pay | Admitting: *Deleted

## 2015-04-11 NOTE — Telephone Encounter (Signed)
Left message on voicemail in reference to upcoming appointment scheduled for 04/13/15. Phone number given for a call back so details instructions can be given. Derek Wilkerson, Ranae Palms

## 2015-04-12 ENCOUNTER — Telehealth (HOSPITAL_COMMUNITY): Payer: Self-pay

## 2015-04-12 NOTE — Telephone Encounter (Signed)
Patient given detailed instructions per Myocardial Perfusion Study Information Sheet for the test on 04/13/2015 at 9:30. Patient notified to arrive 15 minutes early and that it is imperative to arrive on time for appointment to keep from having the test rescheduled.  If you need to cancel or reschedule your appointment, please call the office within 24 hours of your appointment. Failure to do so may result in a cancellation of your appointment, and a $50 no show fee. Patient verbalized understanding.EHK

## 2015-04-13 ENCOUNTER — Ambulatory Visit (INDEPENDENT_AMBULATORY_CARE_PROVIDER_SITE_OTHER): Payer: BC Managed Care – PPO | Admitting: Pharmacist

## 2015-04-13 ENCOUNTER — Ambulatory Visit (HOSPITAL_COMMUNITY): Payer: BC Managed Care – PPO | Attending: Cardiology

## 2015-04-13 DIAGNOSIS — E785 Hyperlipidemia, unspecified: Secondary | ICD-10-CM

## 2015-04-13 DIAGNOSIS — I10 Essential (primary) hypertension: Secondary | ICD-10-CM

## 2015-04-13 DIAGNOSIS — R0602 Shortness of breath: Secondary | ICD-10-CM | POA: Diagnosis not present

## 2015-04-13 DIAGNOSIS — Z8249 Family history of ischemic heart disease and other diseases of the circulatory system: Secondary | ICD-10-CM

## 2015-04-13 DIAGNOSIS — R0609 Other forms of dyspnea: Secondary | ICD-10-CM

## 2015-04-13 DIAGNOSIS — I119 Hypertensive heart disease without heart failure: Secondary | ICD-10-CM | POA: Insufficient documentation

## 2015-04-13 DIAGNOSIS — E7849 Other hyperlipidemia: Secondary | ICD-10-CM

## 2015-04-13 LAB — MYOCARDIAL PERFUSION IMAGING
CHL CUP NUCLEAR SDS: 0
CHL CUP RESTING HR STRESS: 75 {beats}/min
CHL CUP STRESS STAGE 1 HR: 76 {beats}/min
CHL CUP STRESS STAGE 1 SPEED: 0 mph
CHL CUP STRESS STAGE 2 GRADE: 0 %
CHL CUP STRESS STAGE 3 HR: 70 {beats}/min
CHL CUP STRESS STAGE 3 SPEED: 0 mph
CHL CUP STRESS STAGE 4 DBP: 90 mmHg
CHL CUP STRESS STAGE 4 SBP: 161 mmHg
CHL CUP STRESS STAGE 6 SBP: 163 mmHg
CHL CUP STRESS STAGE 6 SPEED: 0 mph
CHL CUP STRESS STAGE 7 HR: 89 {beats}/min
CHL CUP STRESS STAGE 7 SBP: 175 mmHg
CHL CUP STRESS STAGE 7 SPEED: 0 mph
CSEPEW: 1 METS
CSEPPHR: 104 {beats}/min
LVDIAVOL: 161 mL
LVSYSVOL: 93 mL
Percent of predicted max HR: 65 %
RATE: 0.33
SRS: 1
SSS: 1
Stage 1 DBP: 109 mmHg
Stage 1 Grade: 0 %
Stage 1 SBP: 161 mmHg
Stage 2 DBP: 112 mmHg
Stage 2 HR: 70 {beats}/min
Stage 2 SBP: 176 mmHg
Stage 2 Speed: 0 mph
Stage 3 Grade: 0 %
Stage 4 Grade: 0 %
Stage 4 HR: 96 {beats}/min
Stage 4 Speed: 0 mph
Stage 5 Grade: 0 %
Stage 5 HR: 104 {beats}/min
Stage 5 Speed: 0 mph
Stage 6 DBP: 94 mmHg
Stage 6 Grade: 0 %
Stage 6 HR: 101 {beats}/min
Stage 7 DBP: 96 mmHg
Stage 7 Grade: 0 %
TID: 1.02

## 2015-04-13 MED ORDER — REGADENOSON 0.4 MG/5ML IV SOLN
0.4000 mg | Freq: Once | INTRAVENOUS | Status: AC
  Administered 2015-04-13: 0.4 mg via INTRAVENOUS

## 2015-04-13 MED ORDER — TECHNETIUM TC 99M SESTAMIBI GENERIC - CARDIOLITE
10.2000 | Freq: Once | INTRAVENOUS | Status: AC | PRN
  Administered 2015-04-13: 10 via INTRAVENOUS

## 2015-04-13 MED ORDER — TECHNETIUM TC 99M SESTAMIBI GENERIC - CARDIOLITE
31.4000 | Freq: Once | INTRAVENOUS | Status: AC | PRN
  Administered 2015-04-13: 31 via INTRAVENOUS

## 2015-04-13 MED ORDER — ROSUVASTATIN CALCIUM 10 MG PO TABS: 10.0000 mg | ORAL_TABLET | Freq: Every day | ORAL | Status: DC

## 2015-04-13 NOTE — Progress Notes (Signed)
     HPI: Derek Wilkerson is a 60 y.o. male patient referred to lipid clinic by Dr. Marlou Porch.  He has a PMH significant for HTN, hyperlipidemia and a family history of ischemic heart disease.  His baseline LDL in 2013 was 225 mg/dL.  He was treated with Vytorin 10/40 and LDL improved to 64 mg/dL but he was unable to tolerate long term due to myalgias.  He is here today to discuss other statin options.   Current Medications: none Intolerances: Vytorin 10/40 (myalgias) Risk Factors: HTN, family history, age LDL goal: < 100 mg/dL, non-HDL < 130 mg/dL  Diet: Wife is diabetic and cooks most nights; Breakfast - protein shake with peanut butter and almond milk, 3 cups of coffee; lunch - grilled or fried chicken, usually eats out for lunch; Dinner - chicken, vegetables, rice; snacks - love sweets but has cut down, tries not to keep them in the house  Exercise: recently got back in the gym, cardio and weights 3 times per week, 30-40 minutes of cardio  Family History: mother - heart disease age 66; no siblings; does not know about fathers history  Social History: never smoker, occasional alcohol  Labs: 04/02/15- TC 225, TG 119, HDL 32, LDL 169 (on no therapy)  04/04/14- TC 234, TG 123, HDL 38, LDL not reported but would calculate at ~171, AST 48, ALT 64  Past Medical History  Diagnosis Date  . GERD (gastroesophageal reflux disease)   . Arthritis     neck, shoulders, elbows, hands  . Hypertension     states under control with med., has been on med. since age 64  . Carpal tunnel syndrome of right wrist 08/2014  . Dental crown present     Current Outpatient Prescriptions on File Prior to Visit  Medication Sig Dispense Refill  . KRILL OIL PO Take by mouth.    . losartan-hydrochlorothiazide (HYZAAR) 50-12.5 MG tablet Take 1 tablet by mouth daily.     . meloxicam (MOBIC) 15 MG tablet Take 15 mg by mouth daily.    . pantoprazole (PROTONIX) 40 MG tablet TAKE 1 TABLET BY MOUTH TWICE DAILY BEFORE MEALS 60  tablet 5  . sertraline (ZOLOFT) 100 MG tablet Take 150 mg by mouth daily.  2  . Testosterone 20.25 MG/ACT (1.62%) GEL Place onto the skin daily.     No current facility-administered medications on file prior to visit.    Allergies  Allergen Reactions  . Vytorin [Ezetimibe-Simvastatin] Other (See Comments)    Leg/muscle pain    Assessment/Plan: 1. Hyperlipidemia - Patient currently has LDL of 169 above goal <100. He currently is not on medication for lipids. He has taken Vytorin 10/40 in the past but was unable to tolerate due to myalgia. He has recently got back in the gym 3 days per week and is following a healthy diet. He is willing to try another statin. Will start rosuvastatin 10 mg daily. Patient was instructed to call clinic if he notices any muscle pain. Will get follow up lipid panel in 3 months.

## 2015-04-13 NOTE — Patient Instructions (Signed)
Start Crestor 10mg  once daily.  If you have any questions, please call Gay Filler at [phone].  We will recheck your labs in 3 months.

## 2015-04-16 ENCOUNTER — Other Ambulatory Visit: Payer: Self-pay | Admitting: *Deleted

## 2015-04-16 DIAGNOSIS — R9439 Abnormal result of other cardiovascular function study: Secondary | ICD-10-CM

## 2015-05-01 ENCOUNTER — Other Ambulatory Visit: Payer: Self-pay

## 2015-05-01 ENCOUNTER — Ambulatory Visit (HOSPITAL_COMMUNITY): Payer: BC Managed Care – PPO | Attending: Cardiology

## 2015-05-01 DIAGNOSIS — R29898 Other symptoms and signs involving the musculoskeletal system: Secondary | ICD-10-CM | POA: Insufficient documentation

## 2015-05-01 DIAGNOSIS — Z8249 Family history of ischemic heart disease and other diseases of the circulatory system: Secondary | ICD-10-CM | POA: Diagnosis not present

## 2015-05-01 DIAGNOSIS — I340 Nonrheumatic mitral (valve) insufficiency: Secondary | ICD-10-CM | POA: Diagnosis not present

## 2015-05-01 DIAGNOSIS — R9439 Abnormal result of other cardiovascular function study: Secondary | ICD-10-CM | POA: Insufficient documentation

## 2015-05-01 DIAGNOSIS — I119 Hypertensive heart disease without heart failure: Secondary | ICD-10-CM | POA: Diagnosis not present

## 2015-05-01 DIAGNOSIS — I351 Nonrheumatic aortic (valve) insufficiency: Secondary | ICD-10-CM | POA: Diagnosis not present

## 2015-05-01 DIAGNOSIS — E785 Hyperlipidemia, unspecified: Secondary | ICD-10-CM | POA: Insufficient documentation

## 2015-07-06 ENCOUNTER — Other Ambulatory Visit (INDEPENDENT_AMBULATORY_CARE_PROVIDER_SITE_OTHER): Payer: BC Managed Care – PPO | Admitting: *Deleted

## 2015-07-06 DIAGNOSIS — E785 Hyperlipidemia, unspecified: Secondary | ICD-10-CM | POA: Diagnosis not present

## 2015-07-06 DIAGNOSIS — E7849 Other hyperlipidemia: Secondary | ICD-10-CM

## 2015-07-06 LAB — LIPID PANEL
CHOL/HDL RATIO: 3.5 ratio (ref <=5.0)
CHOLESTEROL: 186 mg/dL (ref 125–200)
HDL: 53 mg/dL (ref >=40)
LDL Cholesterol: 113 mg/dL (ref <130)
TRIGLYCERIDES: 101 mg/dL (ref <150)
VLDL: 20 mg/dL (ref <30)

## 2015-07-06 LAB — HEPATIC FUNCTION PANEL
ALBUMIN: 4.5 g/dL (ref 3.6–5.1)
ALT: 54 U/L — AB (ref 9–46)
AST: 43 U/L — ABNORMAL HIGH (ref 10–35)
Alkaline Phosphatase: 53 U/L (ref 40–115)
Bilirubin, Direct: 0.1 mg/dL (ref <=0.2)
Indirect Bilirubin: 0.5 mg/dL (ref 0.2–1.2)
TOTAL PROTEIN: 7.6 g/dL (ref 6.1–8.1)
Total Bilirubin: 0.6 mg/dL (ref 0.2–1.2)

## 2015-07-09 ENCOUNTER — Encounter: Payer: Self-pay | Admitting: Internal Medicine

## 2015-07-20 ENCOUNTER — Other Ambulatory Visit: Payer: Self-pay | Admitting: Cardiology

## 2015-08-10 ENCOUNTER — Ambulatory Visit (INDEPENDENT_AMBULATORY_CARE_PROVIDER_SITE_OTHER): Payer: BC Managed Care – PPO | Admitting: Gastroenterology

## 2015-08-10 ENCOUNTER — Encounter: Payer: Self-pay | Admitting: Gastroenterology

## 2015-08-10 VITALS — BP 151/101 | HR 72 | Temp 97.6°F | Ht 69.0 in | Wt 189.2 lb

## 2015-08-10 DIAGNOSIS — K2100 Gastro-esophageal reflux disease with esophagitis, without bleeding: Secondary | ICD-10-CM

## 2015-08-10 NOTE — Progress Notes (Signed)
Please let patient know that after he left the office, I was further reviewing his previous records and notes that he has mild elevation of liver enzymes which dates back to early 2016. This may be due to fatty liver or even his Crestor, however if he has not been screened for hepatitis B and C we should consider it. We should also offer him an abdominal ultrasound for further evaluation of chronically elevated LFTs.  If patient needs to discuss with me, please let me know. Please schedule if he is agreeable, Hep B surf Ag, Hep C ab, iron/tibc, ferritin, abd u/s.

## 2015-08-10 NOTE — Patient Instructions (Addendum)
1. Return to the office in 03/2017. You will be due for a colonoscopy at that time. 2. Continue pantoprazole 1-2 times daily for acid reflux.

## 2015-08-10 NOTE — Assessment & Plan Note (Signed)
Clinically doing well. Would likely tolerate once daily pantoprazole but if he has flare of heartburn/indigestion/recurrent dysphagia symptoms would increase back to twice a day. He will follow-up in February 2019 at which time we will also schedule him for his surveillance colonoscopy.

## 2015-08-10 NOTE — Progress Notes (Signed)
      Primary Care Physician: Monico Blitz, MD  Primary Gastroenterologist:  Barney Drain, MD   Chief Complaint  Patient presents with  . Follow-up    HPI: Derek Wilkerson is a 60 y.o. male hereFor one-year follow-up of GERD. Last seen in June 2016. February 2016 he had an EGD and colonoscopy and had 1 large sigmoid adenoma, surveillance plan for February 2019. 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 Clinically he is doing well. Takes pantoprazole one to 2 times daily. Sometimes forgets the evening dose. Heartburn well-controlled. No dysphagia. Denies abdominal pain, vomiting, constipation, diarrhea, melena, rectal bleeding.  Current Outpatient Prescriptions  Medication Sig Dispense Refill  . KRILL OIL PO Take by mouth.    . losartan-hydrochlorothiazide (HYZAAR) 50-12.5 MG tablet Take 1 tablet by mouth daily.     . meloxicam (MOBIC) 15 MG tablet Take 15 mg by mouth daily.    . pantoprazole (PROTONIX) 40 MG tablet TAKE 1 TABLET BY MOUTH TWICE DAILY BEFORE MEALS 60 tablet 5  . rosuvastatin (CRESTOR) 10 MG tablet TAKE 1 TABLET(10 MG) BY MOUTH DAILY 30 tablet 6  . sertraline (ZOLOFT) 100 MG tablet Take 150 mg by mouth daily.  2  . Testosterone 20.25 MG/ACT (1.62%) GEL Place onto the skin daily.     No current facility-administered medications for this visit.    Allergies as of 08/10/2015 - Review Complete 08/10/2015  Allergen Reaction Noted  . Vytorin [ezetimibe-simvastatin] Other (See Comments) 04/02/2015    ROS:  General: Negative for anorexia, weight loss, fever, chills, fatigue, weakness. ENT: Negative for hoarseness, difficulty swallowing , nasal congestion. CV: Negative for chest pain, angina, palpitations, dyspnea on exertion, peripheral edema.  Respiratory: Negative for dyspnea at rest, dyspnea on exertion, cough, sputum, wheezing.  GI: See history of present illness. GU:  Negative for dysuria, hematuria, urinary incontinence,  urinary frequency, nocturnal urination.  Endo: Negative for unusual weight change.    Physical Examination:   BP 151/101 mmHg  Pulse 72  Temp(Src) 97.6 F (36.4 C) (Oral)  Ht 5\' 9"  (1.753 m)  Wt 189 lb 3.2 oz (85.821 kg)  BMI 27.93 kg/m2  General: Well-nourished, well-developed in no acute distress.  Eyes: No icterus. Mouth: Oropharyngeal mucosa moist and pink , no lesions erythema or exudate. Lungs: Clear to auscultation bilaterally.  Heart: Regular rate and rhythm, no murmurs rubs or gallops.  Abdomen: Bowel sounds are normal, nontender, nondistended, no hepatosplenomegaly or masses, no abdominal bruits or hernia , no rebound or guarding.   Extremities: No lower extremity edema. No clubbing or deformities. Neuro: Alert and oriented x 4   Skin: Warm and dry, no jaundice.   Psych: Alert and cooperative, normal mood and affect.   Lab Results  Component Value Date   ALT 54* 07/06/2015   AST 43* 07/06/2015   ALKPHOS 53 07/06/2015   BILITOT 0.6 07/06/2015   Lab Results  Component Value Date   CREATININE 1.27* 09/11/2014   BUN 14 09/11/2014   NA 136 09/11/2014   K 4.8 09/11/2014   CL 104 09/11/2014   CO2 23 09/11/2014   Lab Results  Component Value Date   HGB 15.4 09/14/2014

## 2015-08-13 NOTE — Progress Notes (Signed)
I called and informed pt. He would like to check with his PCP and see if he has ever been screened for the hepatitis previously and he will call us back in a few days.

## 2015-08-13 NOTE — Progress Notes (Signed)
CC'D TO PCP °

## 2015-08-20 ENCOUNTER — Telehealth: Payer: Self-pay

## 2015-08-20 ENCOUNTER — Telehealth: Payer: Self-pay | Admitting: Gastroenterology

## 2015-08-20 ENCOUNTER — Other Ambulatory Visit: Payer: Self-pay

## 2015-08-20 DIAGNOSIS — R748 Abnormal levels of other serum enzymes: Secondary | ICD-10-CM

## 2015-08-20 NOTE — Telephone Encounter (Signed)
Pt called to speak with DS about getting labs done and possibly a ultrasound

## 2015-08-20 NOTE — Progress Notes (Signed)
Pt called. He would like to do the labs.  I have entered the labs and he will try to do those tomorrow.  He said he wants to find out more about what the [place] would cost and how much he would have to pay before he does it.  Routing to [place] to discuss with pt. Please call him at the [phone] number.

## 2015-08-20 NOTE — Telephone Encounter (Signed)
Pt called the office again to say that he had a missed call from [place].

## 2015-08-20 NOTE — Telephone Encounter (Signed)
LMOM for a return call.  

## 2015-08-21 NOTE — Telephone Encounter (Signed)
See result note and plan.

## 2015-08-21 NOTE — Telephone Encounter (Signed)
See result note.  

## 2015-08-21 NOTE — Progress Notes (Signed)
LMOM to call back

## 2015-08-21 NOTE — Progress Notes (Signed)
Talked with patient and he is going to have the blood work done and see what it shows then the [place] after the results are back

## 2015-08-22 LAB — HEPATITIS C ANTIBODY: HCV AB: NEGATIVE

## 2015-08-22 LAB — IRON AND TIBC
%SAT: 38 % (ref 15–60)
Iron: 142 ug/dL (ref 50–180)
TIBC: 378 ug/dL (ref 250–425)
UIBC: 236 ug/dL (ref 125–400)

## 2015-08-22 LAB — HEPATITIS B SURFACE ANTIGEN: HEP B S AG: NEGATIVE

## 2015-08-22 LAB — FERRITIN: Ferritin: 27 ng/mL (ref 20–380)

## 2015-08-22 NOTE — Progress Notes (Signed)
Quick Note:  Pt is aware of results. Ok to schedule the [place].  Routing to Ginger. ______

## 2015-08-22 NOTE — Progress Notes (Signed)
Noted and forwarded FYI to Neil Crouch, PA.

## 2015-08-22 NOTE — Progress Notes (Signed)
Quick Note:  Hep B, C negative. Iron studies normal. No evidence of overload. If he would like an ultrasound, we can arrange. ______

## 2015-08-23 ENCOUNTER — Other Ambulatory Visit: Payer: Self-pay

## 2015-08-23 DIAGNOSIS — R945 Abnormal results of liver function studies: Principal | ICD-10-CM

## 2015-08-23 DIAGNOSIS — R7989 Other specified abnormal findings of blood chemistry: Secondary | ICD-10-CM

## 2015-08-27 ENCOUNTER — Ambulatory Visit (HOSPITAL_COMMUNITY)
Admission: RE | Admit: 2015-08-27 | Discharge: 2015-08-27 | Disposition: A | Discharge status: Home / Self Care | Payer: BC Managed Care – PPO | Source: Ambulatory Visit | Attending: Gastroenterology | Admitting: Gastroenterology

## 2015-08-27 DIAGNOSIS — K760 Fatty (change of) liver, not elsewhere classified: Secondary | ICD-10-CM | POA: Diagnosis not present

## 2015-08-27 DIAGNOSIS — R7989 Other specified abnormal findings of blood chemistry: Secondary | ICD-10-CM | POA: Diagnosis present

## 2015-08-27 DIAGNOSIS — R945 Abnormal results of liver function studies: Secondary | ICD-10-CM

## 2015-08-31 NOTE — Progress Notes (Signed)
Quick Note:  See result note attached to recent labs. ______

## 2015-08-31 NOTE — Progress Notes (Signed)
Quick Note:  Please let patient know his u/s shows fatty liver which is likely contributing her elevated ast/alt. Please let patient know of the following instructions. Also he had one area within the liver on u/s that was likely affected more by fat vs hemangioma.   Recommend abd u/s in six month to access stability of "Area greater echogenicity measuring 3.3 x 2.5 x 3.0 cm in the right lobe may be due to greater fat deposition or less likely a hemangioma."  Instructions for fatty liver: Recommend 1-2# weight loss per week until ideal body weight through exercise & diet. Low fat/cholesterol diet.  Avoid sweets, sodas, fruit juices, sweetened beverages like tea, etc. Gradually increase exercise from 15 min daily up to 1 hr per day 5 days/week. Limit alcohol use.  OV in 1 year with [facility] for gerd/fatty liver. ______

## 2015-09-03 ENCOUNTER — Other Ambulatory Visit: Payer: Self-pay | Admitting: Gastroenterology

## 2015-09-03 NOTE — Progress Notes (Signed)
Quick Note:  LMOM to call. ______ 

## 2015-09-03 NOTE — Progress Notes (Signed)
Quick Note:  PT is aware of results. Forwarding to [place] to nic the [place] in 6 months and the OV with SF in one year. ______

## 2016-02-05 ENCOUNTER — Telehealth: Payer: Self-pay | Admitting: Gastroenterology

## 2016-02-05 NOTE — Telephone Encounter (Signed)
Letter mailed

## 2016-02-05 NOTE — Telephone Encounter (Signed)
RECALL FOR ULTRASOUND 

## 2016-05-31 IMAGING — NM NM MISC PROCEDURE
3 series · 18 of 18 positions shown · non-contrast
Comparison: none

[Series 1: wbr_s-proj_st stress_(id)_sa · 6.5mm · 6.51mm/px · 6 of 512 frames shown (1 of 2)]
[frame 43/512]
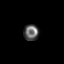
[frame 128/512]
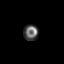
[frame 214/512]
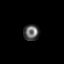
[frame 299/512]
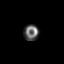
[frame 384/512]
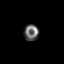
[frame 470/512]
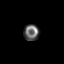

[Series 1: wbr_r-proj_st rest_(id)_sa · 6.5mm · 6.51mm/px · 6 of 64 frames shown]
[frame 6/64]
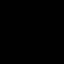
[frame 16/64]
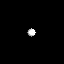
[frame 27/64]
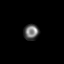
[frame 38/64]
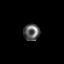
[frame 48/64]
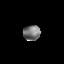
[frame 59/64]
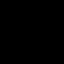

[Series 1: wbr_s-proj_st stress_(id)_sa · 6.5mm · 6.51mm/px · 6 of 64 frames shown (2 of 2)]
[frame 6/64]
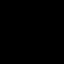
[frame 16/64]
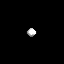
[frame 27/64]
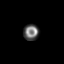
[frame 38/64]
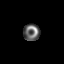
[frame 48/64]
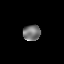
[frame 59/64]
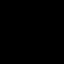

[18 of 18 positions shown; findings below may reference images not displayed]

Canned report from images found in remote index.

Refer to host system for actual result text.

## 2016-07-28 ENCOUNTER — Encounter: Payer: Self-pay | Admitting: Gastroenterology

## 2016-08-18 ENCOUNTER — Other Ambulatory Visit: Payer: Self-pay | Admitting: Gastroenterology

## 2016-09-17 IMAGING — US US ABDOMEN COMPLETE
1 series · 14 of 25 positions shown · non-contrast
Comparison: None.

CLINICAL DATA: Elevated liver function tests since early 2885.

EXAM:
ABDOMEN ULTRASOUND COMPLETE

[Series 1: us abdomen complete · 0.17mm/px · 14 of 121 slices shown]
[im 1/121]
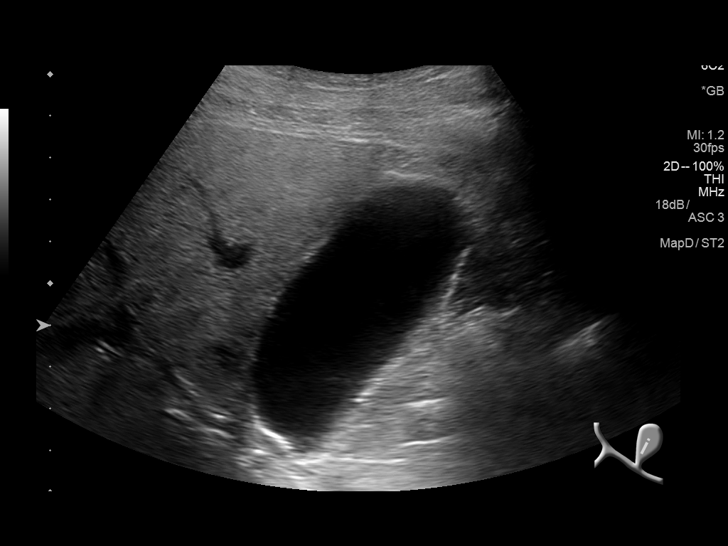
[im 11/121]
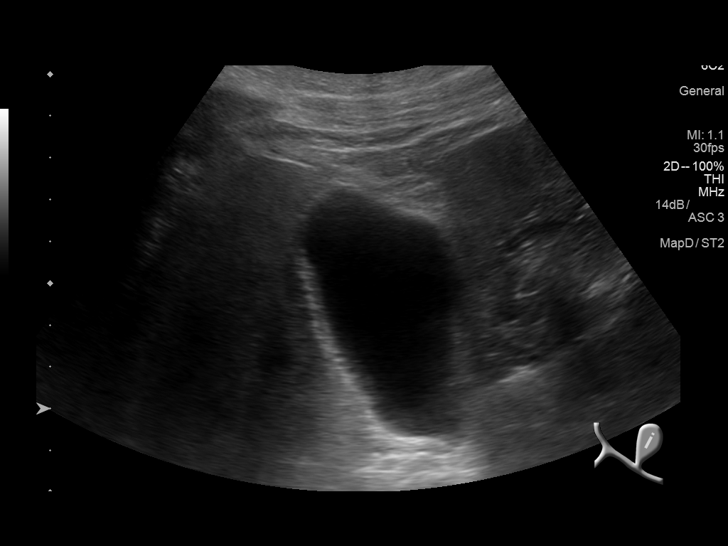
[im 21/121]
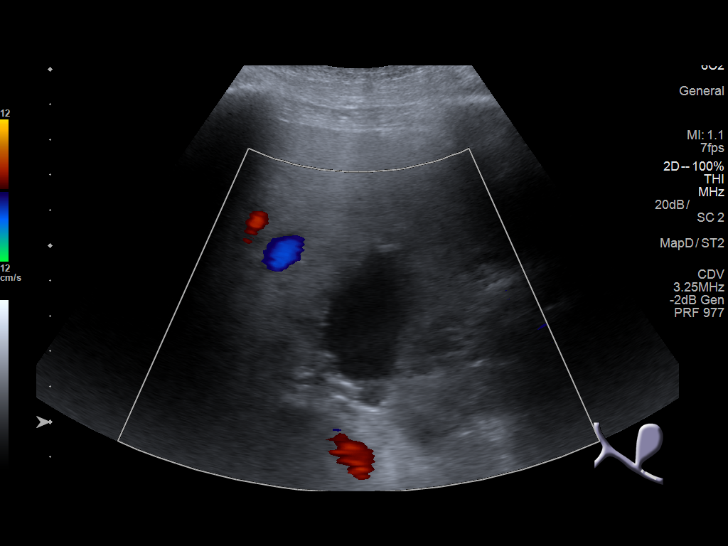
[im 31/121]
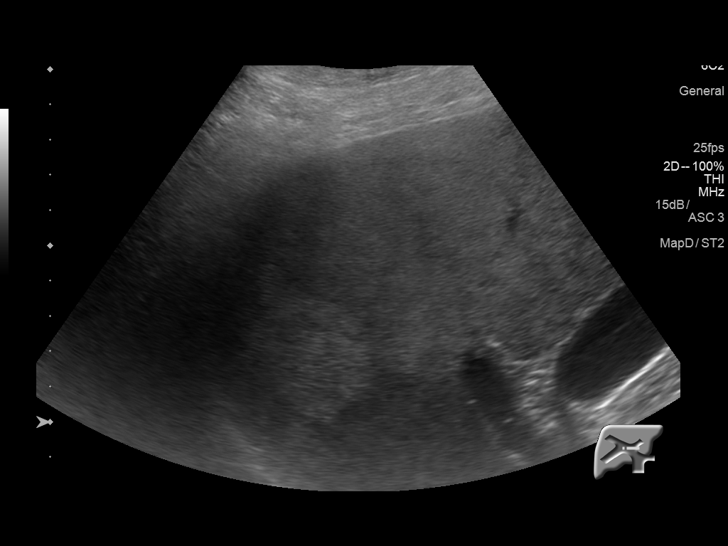
[im 41/121]
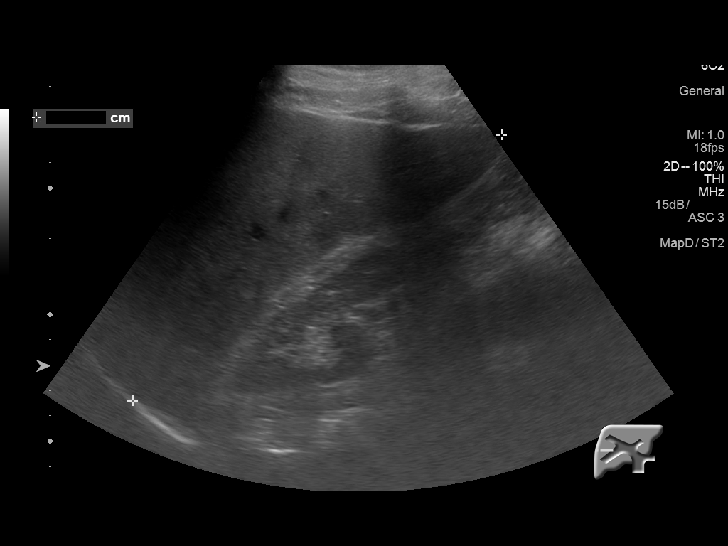
[im 46/121]
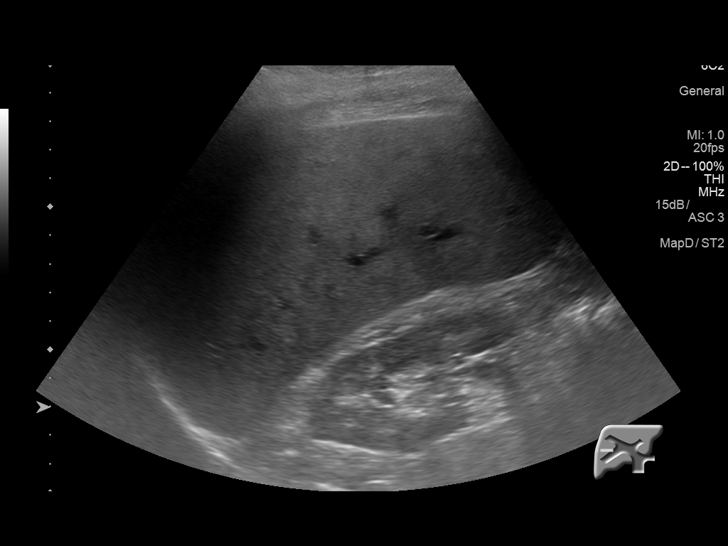
[im 56/121]
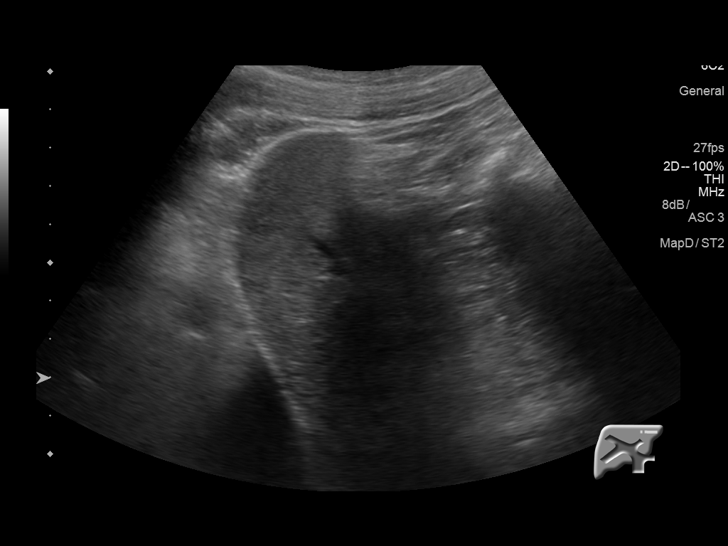
[im 66/121]
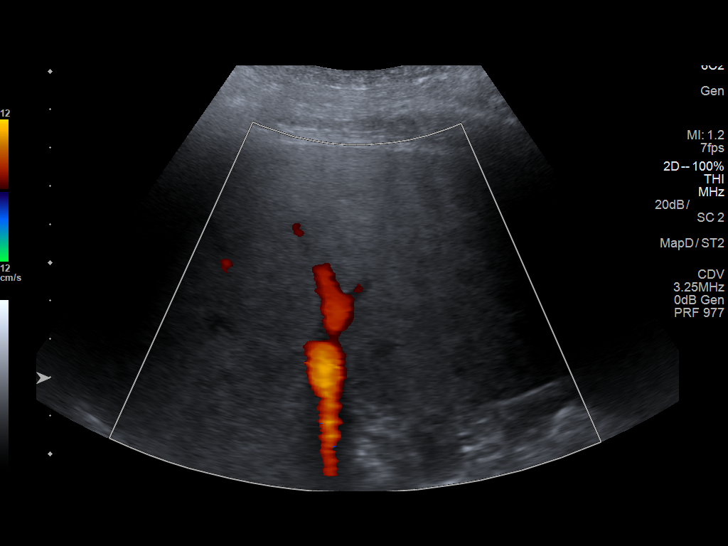
[im 76/121]
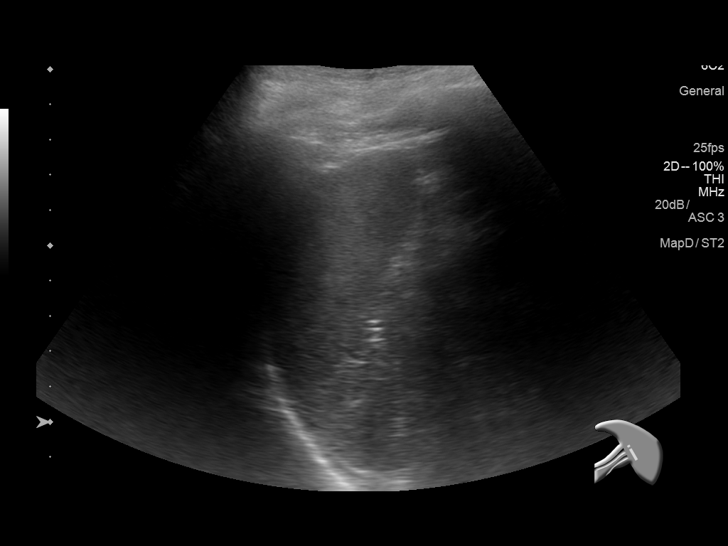
[im 81/121]
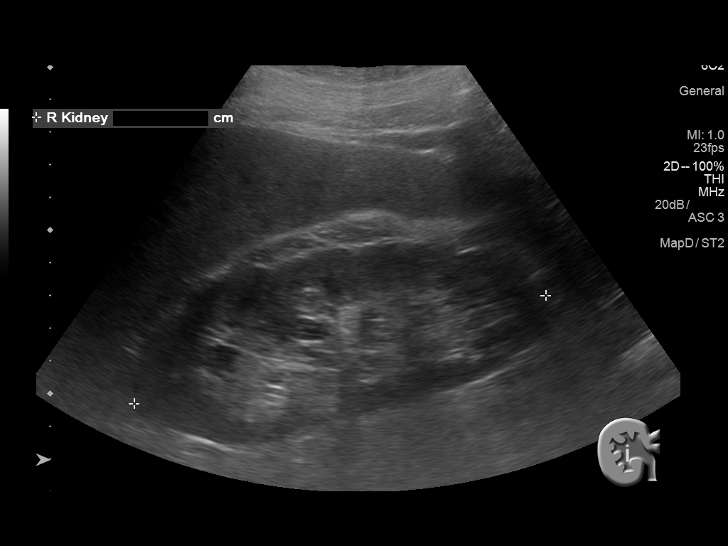
[im 91/121]
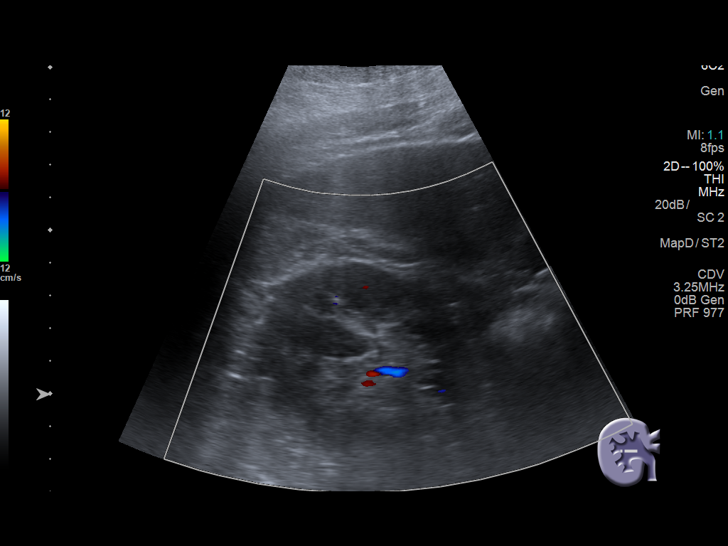
[im 101/121]
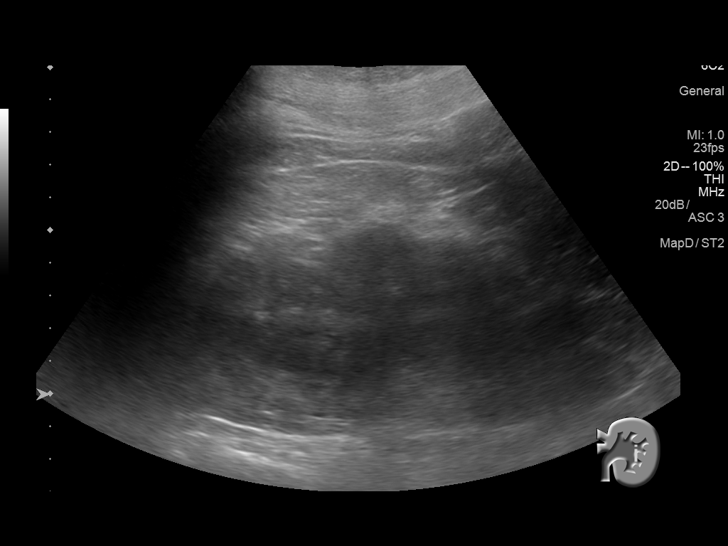
[im 111/121]
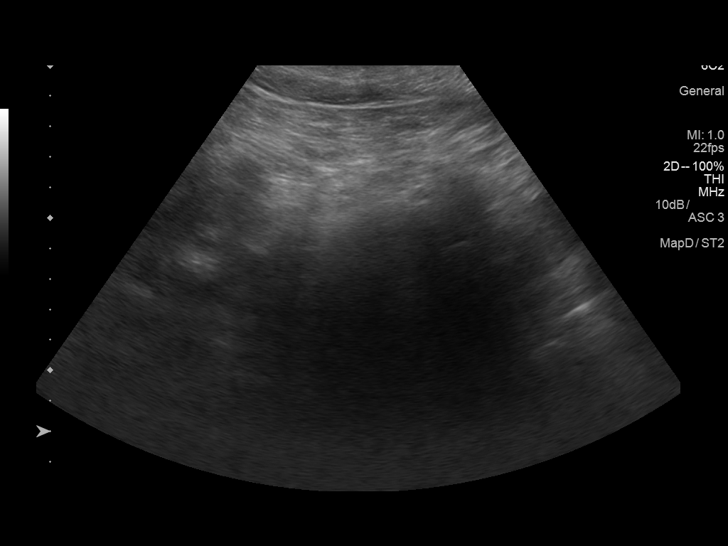
[im 121/121]
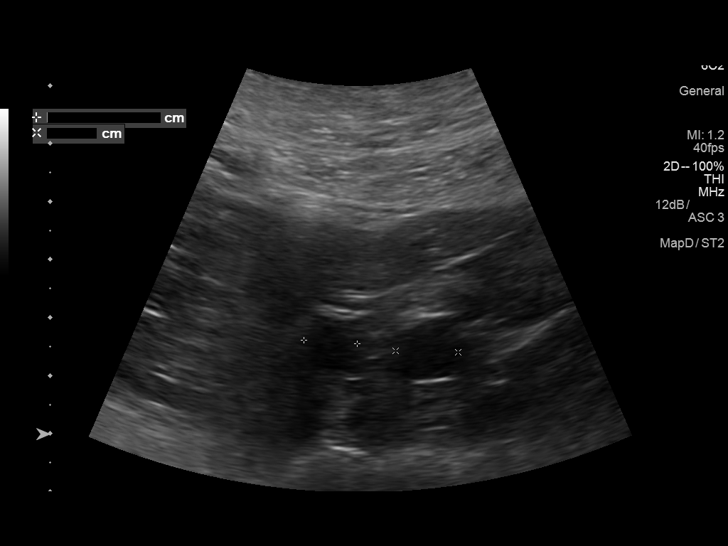

[14 of 25 positions shown; findings below may reference images not displayed]

FINDINGS: Gallbladder: No gallstones or wall thickening visualized. No
sonographic Murphy sign noted by sonographer.

Common bile duct: Diameter: 0.3 cm

Liver: Echogenicity is increased and echotexture is coarsened. Area
greater echogenicity measuring 3.3 x 2.5 x 3.0 cm in the right lobe
may be due to greater fat deposition or less likely a hemangioma.

IVC: No abnormality visualized.

Pancreas: Visualized portion unremarkable.

Spleen: Size and appearance within normal limits.

Right Kidney: Length: 13.0 cm. Echogenicity within normal limits. No
mass or hydronephrosis visualized.

Left Kidney: Length: 12.3 cm. Echogenicity within normal limits. No
mass or hydronephrosis visualized.

Abdominal aorta: No aneurysm visualized.

Other findings: None.
IMPRESSION: No acute abnormality.

Fatty infiltration of the liver.

Negative for gallstones.

## 2016-12-10 ENCOUNTER — Other Ambulatory Visit: Payer: Self-pay | Admitting: Gastroenterology

## 2017-02-24 HISTORY — PX: BACK SURGERY: SHX140

## 2017-02-25 ENCOUNTER — Encounter: Payer: Self-pay | Admitting: Internal Medicine

## 2017-05-19 ENCOUNTER — Encounter: Payer: Self-pay | Admitting: Internal Medicine

## 2017-12-26 ENCOUNTER — Other Ambulatory Visit: Payer: Self-pay | Admitting: Gastroenterology

## 2018-04-28 ENCOUNTER — Other Ambulatory Visit: Payer: Self-pay | Admitting: Nurse Practitioner

## 2018-04-28 ENCOUNTER — Encounter: Payer: Self-pay | Admitting: Internal Medicine

## 2018-04-28 NOTE — Telephone Encounter (Signed)
SCHEDULED AND LETTER SENT  °

## 2018-04-28 NOTE — Telephone Encounter (Signed)
Limited refills provided. Patient needs ov to continue refills.

## 2018-06-21 NOTE — Progress Notes (Signed)
REVIEWED-NO ADDITIONAL RECOMMENDATIONS. 

## 2018-06-23 ENCOUNTER — Other Ambulatory Visit: Payer: Self-pay | Admitting: Gastroenterology

## 2018-07-01 ENCOUNTER — Ambulatory Visit: Payer: BC Managed Care – PPO | Admitting: Gastroenterology

## 2018-07-01 ENCOUNTER — Other Ambulatory Visit: Payer: Self-pay

## 2018-07-01 ENCOUNTER — Telehealth: Payer: Self-pay | Admitting: *Deleted

## 2018-07-01 NOTE — Telephone Encounter (Signed)
Called patient for virtual visit. Did not return call. Please No Show thanks

## 2018-07-05 NOTE — Telephone Encounter (Signed)
Change appointment status to no show

## 2018-07-05 NOTE — Telephone Encounter (Signed)
Can you please cancel the complete status so I can no show patient.

## 2018-07-21 ENCOUNTER — Telehealth: Payer: Self-pay

## 2018-07-21 NOTE — Telephone Encounter (Signed)
Left message for patient to call back regarding scheduling a virtual appointment due to restrictions enacted for Covid 19.

## 2018-08-22 ENCOUNTER — Other Ambulatory Visit: Payer: Self-pay | Admitting: Gastroenterology

## 2018-10-26 ENCOUNTER — Other Ambulatory Visit: Payer: Self-pay | Admitting: Gastroenterology

## 2019-02-21 ENCOUNTER — Other Ambulatory Visit: Payer: Self-pay | Admitting: Gastroenterology

## 2019-06-21 ENCOUNTER — Other Ambulatory Visit: Payer: Self-pay | Admitting: Nurse Practitioner

## 2019-06-23 NOTE — Telephone Encounter (Signed)
Sending in 2 month supply. Patient hasn't been seen since 2017. He will need OV for further refills or obtain refills from PCP.

## 2019-06-24 NOTE — Telephone Encounter (Signed)
Noted. Tried calling pt. Number has changed.

## 2019-08-23 ENCOUNTER — Other Ambulatory Visit: Payer: Self-pay | Admitting: Gastroenterology

## 2020-05-03 ENCOUNTER — Ambulatory Visit: Payer: BC Managed Care – PPO | Admitting: Nurse Practitioner

## 2020-06-07 ENCOUNTER — Other Ambulatory Visit: Payer: Self-pay

## 2020-06-07 ENCOUNTER — Ambulatory Visit: Payer: BC Managed Care – PPO | Admitting: Nurse Practitioner

## 2020-06-07 ENCOUNTER — Encounter: Payer: Self-pay | Admitting: Nurse Practitioner

## 2020-06-07 DIAGNOSIS — Z860100 Personal history of colon polyps, unspecified: Secondary | ICD-10-CM | POA: Insufficient documentation

## 2020-06-07 MED ORDER — SUPREP BOWEL PREP KIT 17.5-3.13-1.6 GM/177ML PO SOLN: 1.0000 | ORAL | 0 refills | Status: DC

## 2020-06-07 NOTE — Progress Notes (Signed)
Primary Care Physician:  Monico Blitz, MD Primary Gastroenterologist:  Dr. Abbey Chatters  Chief Complaint  Patient presents with  . Colonoscopy    Wants to discuss doing cologuard instead    HPI:   Derek Wilkerson is a 65 y.o. male who presents to schedule colonoscopy.  The patient's last colonoscopy was completed 03/31/2014 which found a large sigmoid colon polyp, small transverse colon polyp, moderate internal hemorrhoids.  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  Recommended repeat colonoscopy in 3 years.  He is currently overdue.  Today states doing okay overall. He states the last time he had the a colonoscopy he had some bleeding. He also had a large out of pocket expense. He states he is retiring August 2nd and needs to have it done before now. Denies abdominal pain, N/V, hematochezia, melena, fever, chills, unintentional weight loss. Denies URI or flu-like symptoms. Denies loss of sense of taste or smell. The patient has received COVID-19 vaccination(s). They have also had a booster. Denies chest pain, dyspnea, dizziness, lightheadedness, syncope, near syncope. Denies any other upper or lower GI symptoms.  Past Medical History:  Diagnosis Date  . Arthritis    neck, shoulders, elbows, hands  . Carpal tunnel syndrome of right wrist 08/2014  . Dental crown present   . GERD (gastroesophageal reflux disease)   . Hypertension    states under control with med., has been on med. since age 45    Past Surgical History:  Procedure Laterality Date  . CARPAL TUNNEL RELEASE Right 09/14/2014   Procedure: RIGHT CARPAL TUNNEL RELEASE;  Surgeon: Leanora Cover, MD;  Location: [facility];  Service: Orthopedics;  Laterality: Right;  . COLONOSCOPY    . COLONOSCOPY N/A 03/31/2014   Dr. Oneida Alar: One large sigmoid polyp removed. One small transverse colon polyp removed. Moderate internal hemorrhoids. path with sessile serrated polyp  and leiomyoma of transverse colon, sigmoid tubular adenoma. Surveillance 2019  . ESOPHAGOGASTRODUODENOSCOPY N/A 03/31/2014   Dr. Oneida Alar: 1. Stricture at the gastroesophageal junction 2. mild non-erosive gastritis and duodentitis 3. moderate hiatal hernia  . GANGLION CYST EXCISION Right 09/14/2014   Procedure: REMOVAL GANGLION CYST WRIST;  Surgeon: Leanora Cover, MD;  Location: [facility];  Service: Orthopedics;  Laterality: Right;  . LASIK Bilateral 1999  . MALONEY DILATION N/A 03/31/2014   Procedure: MALONEY DILATION;  Surgeon: [name], MD;  [facility];  Service: Endoscopy;  Laterality: N/A;  . NASAL SINUS SURGERY    . SAVORY DILATION N/A 03/31/2014   Procedure: SAVORY DILATION;  Surgeon: [name], MD;  Location: [facility];  Service: Endoscopy;  Laterality: N/A;  . SHOULDER ARTHROSCOPY W/ ROTATOR CUFF REPAIR Right 2001    Current Outpatient Medications  Medication Sig Dispense Refill  . hydrochlorothiazide (MICROZIDE) 12.5 MG capsule Take 12.5 mg by mouth daily.    Marland Kitchen losartan (COZAAR) 100 MG tablet Take 100 mg by mouth daily.    Marland Kitchen MILK THISTLE PO Take by mouth daily.    . pantoprazole (PROTONIX) 40 MG tablet TAKE 1 TABLET BY MOUTH UP TO TWICE DAILY BEFORE A MEAL 60 tablet 1  . Saw Palmetto, Serenoa repens, (SAW PALMETTO PO) Take by mouth daily.    . sertraline (ZOLOFT) 100 MG tablet Take 150 mg by mouth daily.  2  . TESTOSTERONE CYPIONATE IJ Inject as directed. Every 2 weeks    . Thiamine Mononitrate (RA  VITAMIN B-1 PO) Take by mouth. daily    . KRILL OIL PO Take by mouth. (Patient not taking: Reported on 06/07/2020)    . losartan-hydrochlorothiazide (HYZAAR) 50-12.5 MG tablet Take 1 tablet by mouth daily.  (Patient not taking: Reported on 06/07/2020)    . meloxicam (MOBIC) 15 MG tablet Take 15 mg by mouth daily. (Patient not taking: Reported on 06/07/2020)    . rosuvastatin (CRESTOR) 10 MG tablet TAKE 1 TABLET(10 MG) BY MOUTH DAILY (Patient not taking:  Reported on 06/07/2020) 30 tablet 6  . Testosterone 20.25 MG/ACT (1.62%) GEL Place onto the skin daily. (Patient not taking: Reported on 06/07/2020)     No current facility-administered medications for this visit.    Allergies as of 06/07/2020 - Review Complete 06/07/2020  Allergen Reaction Noted  . Vytorin [ezetimibe-simvastatin] Other (See Comments) 04/02/2015    Family History  Problem Relation Age of Onset  . Colon cancer Maternal Grandmother   . Heart attack Mother     Social History   Socioeconomic History  . Marital status: Married    Spouse name: Not on file  . Number of children: Not on file  . Years of education: Not on file  . Highest education level: Not on file  Occupational History  . Occupation: Animator  Tobacco Use  . Smoking status: Never Smoker  . Smokeless tobacco: Never Used  Substance and Sexual Activity  . Alcohol use: Yes    Alcohol/week: 0.0 standard drinks    Comment: occasionally  . Drug use: No  . Sexual activity: Not on file  Other Topics Concern  . Not on file  Social History Narrative  . Not on file   Social Determinants of Health   Financial Resource Strain: Not on file  Food Insecurity: Not on file  Transportation Needs: Not on file  Physical Activity: Not on file  Stress: Not on file  Social Connections: Not on file  Intimate Partner Violence: Not on file    Subjective: Review of Systems  Constitutional: Negative for chills, fever, malaise/fatigue and weight loss.  HENT: Negative for congestion and sore throat.   Respiratory: Negative for cough and shortness of breath.   Cardiovascular: Negative for chest pain and palpitations.  Gastrointestinal: Negative for abdominal pain, blood in stool, diarrhea, melena, nausea and vomiting.  Musculoskeletal: Negative for joint pain and myalgias.  Skin: Negative for rash.  Neurological: Negative for dizziness and weakness.  Endo/Heme/Allergies: Does not bruise/bleed easily.   Psychiatric/Behavioral: Negative for depression. The patient is not nervous/anxious.   All other systems reviewed and are negative.      Objective: BP 139/89   Pulse 70   Temp (!) 97.5 F (36.4 C)   Ht _0  (1.753 m)   Wt 192 lb 9.6 oz (87.4 kg)   BMI 28.44 kg/m  Physical Exam Vitals and nursing note reviewed.  Constitutional:      General: He is not in acute distress.    Appearance: Normal appearance. He is normal weight. He is not ill-appearing, toxic-appearing or diaphoretic.  HENT:     Head: Normocephalic and atraumatic.     Nose: No congestion or rhinorrhea.  Eyes:     General: No scleral icterus. Cardiovascular:     Rate and Rhythm: Normal rate and regular rhythm.     Heart sounds: Normal heart sounds.  Pulmonary:     Effort: Pulmonary effort is normal.     Breath sounds: Normal breath sounds.  Abdominal:  General: Bowel sounds are normal. There is no distension.     Palpations: Abdomen is soft. There is no hepatomegaly, splenomegaly or mass.     Tenderness: There is no abdominal tenderness. There is no guarding or rebound.     Hernia: No hernia is present.  Musculoskeletal:     Cervical back: Neck supple.  Skin:    General: Skin is warm and dry.     Coloration: Skin is not jaundiced.     Findings: No bruising or rash.  Neurological:     General: No focal deficit present.     Mental Status: He is alert and oriented to person, place, and time. Mental status is at baseline.  Psychiatric:        Mood and Affect: Mood normal.        Behavior: Behavior normal.        Thought Content: Thought content normal.      Assessment:  Very pleasant 65 year old male presents for repeat colonoscopy.  Last colonoscopy in 2016 with a couple of tubular adenoma polyps recommended 3-year repeat (2019).  He is currently 3 years overdue.  He is generally asymptomatic from a GI standpoint.  We will proceed with colonoscopy at this time.  Proceed with [facility] on propofol/MAC  with Dr. Abbey Chatters on propofol/MAC in near future: the risks, benefits, and alternatives have been discussed with the patient in detail. The patient states understanding and desires to proceed.  ASA 2   Plan: 1. Colonoscopy as described above 2. Follow-up based on post procedure recommendations or as needed for GI symptoms    Thank you for allowing [place] to participate in the care of [place], DNP, AGNP-C Adult & Gerontological Nurse Practitioner [facility]   06/07/2020 2:45 PM   Disclaimer: This note was dictated with voice recognition software. Similar sounding words can inadvertently be transcribed and may not be corrected upon review.

## 2020-06-07 NOTE — Patient Instructions (Signed)
Your health issues we discussed today were:   History of colon polyps/need for colonoscopy: 1. We will schedule your colonoscopy for you 2. Further recommendations will follow your colonoscopy 3. Call us if you have any questions or problems with the bowel prep  Overall I recommend:  1. Continue other current medications 2. Return for follow-up based on recommendations made after colonoscopy, or as needed for GI symptoms 3. Call us for any questions or concerns   At [facility] we value your feedback. You may receive a survey about your visit today. Please share your experience as we strive to create trusting relationships with our patients to provide genuine, compassionate, quality care.  We appreciate your understanding and patience as we review any laboratory studies, imaging, and other diagnostic tests that are ordered as we care for you. Our office policy is 5 business days for review of these results, and any emergent or urgent results are addressed in a timely manner for your best interest. If you do not hear from our office in 1 week, please contact us.   We also encourage the use of MyChart, which contains your medical information for your review as well. If you are not enrolled in this feature, an access code is on this after visit summary for your convenience. Thank you for allowing [place] to be involved in your care.  It was great to see you today!  I hope you have a great spring and happy Easter!!

## 2020-08-08 ENCOUNTER — Other Ambulatory Visit (HOSPITAL_COMMUNITY): Payer: BC Managed Care – PPO

## 2020-08-08 ENCOUNTER — Other Ambulatory Visit (HOSPITAL_COMMUNITY)
Admission: RE | Admit: 2020-08-08 | Discharge: 2020-08-08 | Disposition: A | Discharge status: Home / Self Care | Payer: BC Managed Care – PPO | Source: Ambulatory Visit | Attending: Internal Medicine | Admitting: Internal Medicine

## 2020-08-08 ENCOUNTER — Other Ambulatory Visit: Payer: Self-pay

## 2020-08-08 DIAGNOSIS — K648 Other hemorrhoids: Secondary | ICD-10-CM | POA: Diagnosis not present

## 2020-08-08 DIAGNOSIS — Z86010 Personal history of colonic polyps: Secondary | ICD-10-CM | POA: Diagnosis not present

## 2020-08-08 DIAGNOSIS — Z79890 Hormone replacement therapy (postmenopausal): Secondary | ICD-10-CM | POA: Diagnosis not present

## 2020-08-08 DIAGNOSIS — Z1211 Encounter for screening for malignant neoplasm of colon: Secondary | ICD-10-CM | POA: Diagnosis present

## 2020-08-08 DIAGNOSIS — K5730 Diverticulosis of large intestine without perforation or abscess without bleeding: Secondary | ICD-10-CM | POA: Diagnosis not present

## 2020-08-08 DIAGNOSIS — I10 Essential (primary) hypertension: Secondary | ICD-10-CM | POA: Diagnosis not present

## 2020-08-08 DIAGNOSIS — Z79899 Other long term (current) drug therapy: Secondary | ICD-10-CM | POA: Diagnosis not present

## 2020-08-08 LAB — BASIC METABOLIC PANEL
Anion gap: 7 (ref 5–15)
BUN: 17 mg/dL (ref 8–23)
CO2: 26 mmol/L (ref 22–32)
Calcium: 9.2 mg/dL (ref 8.9–10.3)
Chloride: 102 mmol/L (ref 98–111)
Creatinine, Ser: 1.12 mg/dL (ref 0.61–1.24)
GFR, Estimated: >60 mL/min (ref >60)
Glucose, Bld: 87 mg/dL (ref 70–99)
Potassium: 4.1 mmol/L (ref 3.5–5.1)
Sodium: 135 mmol/L (ref 135–145)

## 2020-08-10 ENCOUNTER — Other Ambulatory Visit: Payer: Self-pay

## 2020-08-10 ENCOUNTER — Encounter (HOSPITAL_COMMUNITY): Payer: Self-pay

## 2020-08-10 ENCOUNTER — Encounter (HOSPITAL_COMMUNITY)
Admission: RE | Disposition: A | Discharge status: Home / Self Care | Payer: Self-pay | Source: Home / Self Care | Attending: Internal Medicine

## 2020-08-10 ENCOUNTER — Ambulatory Visit (HOSPITAL_COMMUNITY): Payer: BC Managed Care – PPO | Admitting: Certified Registered Nurse Anesthetist

## 2020-08-10 ENCOUNTER — Ambulatory Visit (HOSPITAL_COMMUNITY)
Admission: RE | Admit: 2020-08-10 | Discharge: 2020-08-10 | Disposition: A | Discharge status: Home / Self Care | Payer: BC Managed Care – PPO | Attending: Internal Medicine | Admitting: Internal Medicine

## 2020-08-10 DIAGNOSIS — Z86010 Personal history of colonic polyps: Secondary | ICD-10-CM | POA: Insufficient documentation

## 2020-08-10 DIAGNOSIS — Z1211 Encounter for screening for malignant neoplasm of colon: Secondary | ICD-10-CM | POA: Diagnosis not present

## 2020-08-10 DIAGNOSIS — K5730 Diverticulosis of large intestine without perforation or abscess without bleeding: Secondary | ICD-10-CM | POA: Diagnosis not present

## 2020-08-10 DIAGNOSIS — Z79899 Other long term (current) drug therapy: Secondary | ICD-10-CM | POA: Insufficient documentation

## 2020-08-10 DIAGNOSIS — Z79890 Hormone replacement therapy (postmenopausal): Secondary | ICD-10-CM | POA: Insufficient documentation

## 2020-08-10 DIAGNOSIS — K648 Other hemorrhoids: Secondary | ICD-10-CM | POA: Insufficient documentation

## 2020-08-10 DIAGNOSIS — K6289 Other specified diseases of anus and rectum: Secondary | ICD-10-CM

## 2020-08-10 DIAGNOSIS — I10 Essential (primary) hypertension: Secondary | ICD-10-CM | POA: Insufficient documentation

## 2020-08-10 HISTORY — PX: COLONOSCOPY WITH PROPOFOL: SHX5780

## 2020-08-10 SURGERY — COLONOSCOPY WITH PROPOFOL
Anesthesia: General

## 2020-08-10 MED ORDER — LACTATED RINGERS IV SOLN
INTRAVENOUS | Status: DC
  Administered 2020-08-10: 1000 mL via INTRAVENOUS

## 2020-08-10 MED ORDER — PROPOFOL 500 MG/50ML IV EMUL
INTRAVENOUS | Status: DC | PRN
  Administered 2020-08-10: 150 ug/kg/min via INTRAVENOUS

## 2020-08-10 MED ORDER — STERILE WATER FOR IRRIGATION IR SOLN
Status: DC | PRN
  Administered 2020-08-10: 1.5 mL

## 2020-08-10 MED ORDER — LIDOCAINE HCL (CARDIAC) PF 100 MG/5ML IV SOSY
PREFILLED_SYRINGE | INTRAVENOUS | Status: DC | PRN
  Administered 2020-08-10: 50 mg via INTRAVENOUS

## 2020-08-10 MED ORDER — PROPOFOL 10 MG/ML IV BOLUS
INTRAVENOUS | Status: DC | PRN
  Administered 2020-08-10: 100 mg via INTRAVENOUS
  Administered 2020-08-10: 40 mg via INTRAVENOUS

## 2020-08-10 NOTE — Op Note (Signed)
[facility] Patient Name: Derek Wilkerson Procedure Date: 08/10/2020 12:28 PM MRN: [id number] Date of Birth: [birth year] Attending MD: Elon Alas. Abbey Chatters DO CSN: [id number] Age: 65 Admit Type: Outpatient Procedure:                Colonoscopy Indications:              High risk colon cancer surveillance: Personal                            history of colonic polyps Providers:                Elon Alas. Abbey Chatters, DO, Charlsie Quest. Theda Sers RN, RN,                            Aram Candela Referring MD:              Medicines:                See the Anesthesia note for documentation of the                            administered medications Complications:            No immediate complications. Estimated Blood Loss:     Estimated blood loss was minimal. Procedure:                Pre-Anesthesia Assessment:                           - The anesthesia plan was to use monitored                            anesthesia care (MAC).                           After obtaining informed consent, the colonoscope                            was passed under direct vision. Throughout the                            procedure, the patient's blood pressure, pulse, and                            oxygen saturations were monitored continuously. The                            PCF-HQ190L ([id number]) scope was introduced through                            the anus and advanced to the the cecum, identified                            by appendiceal orifice and ileocecal valve. The                            colonoscopy was performed without difficulty. The  patient tolerated the procedure well. The quality                            of the bowel preparation was evaluated using the                            BBPS [facility] Bowel Preparation Scale) with scores                            of: Right Colon = 3, Transverse Colon = 3 and Left                            Colon = 3 (entire mucosa seen well with no  residual                            staining, small fragments of stool or opaque                            liquid). The total BBPS score equals 9. Scope In: 12:41:20 PM Scope Out: 12:53:46 PM Scope Withdrawal Time: 0 hours 8 minutes 45 seconds  Total Procedure Duration: 0 hours 12 minutes 26 seconds  Findings:      The perianal and digital rectal examinations were normal.      Non-bleeding internal hemorrhoids were found during endoscopy.      Multiple small-mouthed diverticula were found in the sigmoid colon.      A tattoo was seen in the sigmoid colon. A post-polypectomy scar was       found at the tattoo site. Biopsies were taken with a cold forceps for       histology.      The exam was otherwise without abnormality. Impression:               - Non-bleeding internal hemorrhoids.                           - Diverticulosis in the sigmoid colon.                           - A tattoo was seen in the sigmoid colon. A                            post-polypectomy scar was found at the tattoo site.                            Biopsied.                           - The examination was otherwise normal. Moderate Sedation:      Per Anesthesia Care Recommendation:           - Patient has a contact number available for                            emergencies. The signs and symptoms of potential  delayed complications were discussed with the                            patient. Return to normal activities tomorrow.                            Written discharge instructions were provided to the                            patient.                           - Resume previous diet.                           - Continue present medications.                           - Await pathology results.                           - Repeat colonoscopy in 10 years for screening                            purposes.                           - Return to GI clinic PRN. Procedure Code(s):         --- Professional ---                           [phone], Colonoscopy, flexible; with biopsy, single                            or multiple Diagnosis Code(s):        --- Professional ---                           Z86.010, Personal history of colonic polyps                           K64.8, Other hemorrhoids                           K57.30, Diverticulosis of large intestine without                            perforation or abscess without bleeding CPT copyright 2019 American Medical Association. All rights reserved. The codes documented in this report are preliminary and upon coder review may  be revised to meet current compliance requirements. Elon Alas. Abbey Chatters, DO [place] Abbey Chatters, DO 08/10/2020 12:56:57 PM This report has been signed electronically. Number of Addenda: 0

## 2020-08-10 NOTE — H&P (Signed)
Primary Care Physician:  Elby Showers, DO Primary Gastroenterologist:  Dr. Abbey Chatters  Pre-Procedure History & Physical: HPI:  Derek Wilkerson is a 65 y.o. male is here for a colonoscopy for surveillance due to history of adenomatous colon polyps. Last colonoscopy in 2016 with a couple of tubular adenoma polyps recommended 3-year repeat (2019).  He is currently 3 years overdue.  He is generally asymptomatic from a GI standpoint.  We will proceed with colonoscopy at this time.  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 since age 29    Past Surgical History:  Procedure Laterality Date   CARPAL TUNNEL RELEASE Right 09/14/2014   Procedure: RIGHT CARPAL TUNNEL RELEASE;  Surgeon: Leanora Cover, MD;  Location: [place];  Service: Orthopedics;  Laterality: Right;   COLONOSCOPY     COLONOSCOPY N/A 03/31/2014   Dr. Oneida Alar: One large sigmoid polyp removed. One small transverse colon polyp removed. Moderate internal hemorrhoids. path with sessile serrated polyp and leiomyoma of transverse colon, sigmoid tubular adenoma. Surveillance 2019   ESOPHAGOGASTRODUODENOSCOPY N/A 03/31/2014   Dr. Oneida Alar: 1. Stricture at the gastroesophageal junction 2. mild non-erosive gastritis and duodentitis 3. moderate hiatal hernia   GANGLION CYST EXCISION Right 09/14/2014   Procedure: REMOVAL GANGLION CYST WRIST;  Surgeon: Leanora Cover, MD;  Location: [place];  Service: Orthopedics;  Laterality: Right;   LASIK Bilateral 1999   MALONEY DILATION N/A 03/31/2014   Procedure: MALONEY DILATION;  Surgeon: [name], MD;  [facility];  Service: Endoscopy;  Laterality: N/A;   NASAL SINUS SURGERY     SAVORY DILATION N/A 03/31/2014   Procedure: SAVORY DILATION;  Surgeon:  [name], MD;  [facility];  Service: Endoscopy;  Laterality: N/A;   SHOULDER ARTHROSCOPY W/ ROTATOR CUFF REPAIR Right 2001    Prior to Admission medications   Medication Sig Start Date End Date Taking? Authorizing Provider  b complex vitamins capsule Take 1 capsule by mouth daily.   Yes [provider]  Carboxymethylcellul-Glycerin (LUBRICATING EYE DROPS OP) Place 1 drop into both eyes daily as needed (dry eyes).   Yes [provider]  diphenhydramine-acetaminophen (TYLENOL PM) 25-500 MG TABS tablet Take 1 tablet by mouth at bedtime as needed (sleep).   Yes [provider]  gabapentin (NEURONTIN) 300 MG capsule Take 300 mg by mouth 3 (three) times daily.   Yes [provider]  hydrochlorothiazide (MICROZIDE) 12.5 MG capsule Take 12.5 mg by mouth daily.   Yes [provider]  ibuprofen (ADVIL) 200 MG tablet Take 400 mg by mouth every 6 (six) hours as needed for moderate pain or headache.   Yes [provider]  losartan (COZAAR) 100 MG tablet Take 100 mg by mouth daily.   Yes [provider]  Milk Thistle 1000 MG CAPS Take 1,000 mg by mouth daily.   Yes [provider]  pantoprazole (PROTONIX) 40 MG tablet TAKE 1 TABLET BY MOUTH UP TO TWICE DAILY BEFORE A MEAL Patient taking differently: Take 40 mg by mouth 2 (two) times daily. 06/23/19  Yes Harper, Kristen S, PA-C  Saw Palmetto 450 MG CAPS Take 450 mg by mouth daily.   Yes [provider]  sertraline (  ZOLOFT) 100 MG tablet Take 150 mg by mouth daily. 02/16/14  Yes [provider]  testosterone cypionate (DEPOTESTOSTERONE CYPIONATE) 200 MG/ML injection Inject 200 mg into the muscle every 14 (fourteen) days.   Yes [provider]  Na Sulfate-K Sulfate-Mg Sulf (SUPREP BOWEL PREP KIT) 17.5-3.13-1.6 GM/177ML SOLN Take 1 kit by mouth as directed. 06/07/20   Eloise Harman, DO  rosuvastatin (CRESTOR) 10 MG tablet TAKE 1 TABLET(10 MG) BY  MOUTH DAILY Patient not taking: No sig reported 07/20/15   Jerline Pain, MD    Allergies as of 06/07/2020 - Review Complete 06/07/2020  Allergen Reaction Noted   Vytorin [ezetimibe-simvastatin] Other (See Comments) 04/02/2015    Family History  Problem Relation Age of Onset   Colon cancer Maternal Grandmother    Heart attack Mother     Social History   Socioeconomic History   Marital status: Married    Spouse name: Not on file   Number of children: Not on file   Years of education: Not on file   Highest education level: Not on file  Occupational History   Occupation: [employer]  Tobacco Use   Smoking status: Never   Smokeless tobacco: Never  Substance and Sexual Activity   Alcohol use: Yes    Alcohol/week: 0.0 standard drinks    Comment: occasionally   Drug use: No   Sexual activity: Not on file  Other Topics Concern   Not on file  Social History Narrative   Not on file   Social Determinants of Health   Financial Resource Strain: Not on file  Food Insecurity: Not on file  Transportation Needs: Not on file  Physical Activity: Not on file  Stress: Not on file  Social Connections: Not on file  Intimate Partner Violence: Not on file    Review of Systems: See HPI, otherwise negative ROS  Physical Exam: Vital signs in last 24 hours:     General:   Alert,  Well-developed, well-nourished, pleasant and cooperative in NAD Head:  Normocephalic and atraumatic. Eyes:  Sclera clear, no icterus.   Conjunctiva pink. Ears:  Normal auditory acuity. Nose:  No deformity, discharge,  or lesions. Mouth:  No deformity or lesions, dentition normal. Neck:  Supple; no masses or thyromegaly. Lungs:  Clear throughout to auscultation.   No wheezes, crackles, or rhonchi. No acute distress. Heart:  Regular rate and rhythm; no murmurs, clicks, rubs,  or gallops. Abdomen:  Soft, nontender and nondistended. No masses, hepatosplenomegaly or hernias noted. Normal bowel sounds,  without guarding, and without rebound.   Msk:  Symmetrical without gross deformities. Normal posture. Extremities:  Without clubbing or edema. Neurologic:  Alert and  oriented x4;  grossly normal neurologically. Skin:  Intact without significant lesions or rashes. Cervical Nodes:  No significant cervical adenopathy. Psych:  Alert and cooperative. Normal mood and affect.  Impression/Plan: Derek Wilkerson is here for a colonoscopy for surveillance due to history of adenomatous colon polyps.   The risks of the procedure including infection, bleed, or perforation as well as benefits, limitations, alternatives and imponderables have been reviewed with the patient. Questions have been answered. All parties agreeable.

## 2020-08-10 NOTE — Discharge Instructions (Addendum)
  Colonoscopy Discharge Instructions  Read the instructions outlined below and refer to this sheet in the next few weeks. These discharge instructions provide you with general information on caring for yourself after you leave the hospital. Your doctor may also give you specific instructions. While your treatment has been planned according to the most current medical practices available, unavoidable complications occasionally occur.   ACTIVITY You may resume your regular activity, but move at a slower pace for the next 24 hours.  Take frequent rest periods for the next 24 hours.  Walking will help get rid of the air and reduce the bloated feeling in your belly (abdomen).  No driving for 24 hours (because of the medicine (anesthesia) used during the test).   Do not sign any important legal documents or operate any machinery for 24 hours (because of the anesthesia used during the test).  NUTRITION Drink plenty of fluids.  You may resume your normal diet as instructed by your doctor.  Begin with a light meal and progress to your normal diet. Heavy or fried foods are harder to digest and may make you feel sick to your stomach (nauseated).  Avoid alcoholic beverages for 24 hours or as instructed.  MEDICATIONS You may resume your normal medications unless your doctor tells you otherwise.  WHAT YOU CAN EXPECT TODAY Some feelings of bloating in the abdomen.  Passage of more gas than usual.  Spotting of blood in your stool or on the toilet paper.  IF YOU HAD POLYPS REMOVED DURING THE COLONOSCOPY: No aspirin products for 7 days or as instructed.  No alcohol for 7 days or as instructed.  Eat a soft diet for the next 24 hours.  FINDING OUT THE RESULTS OF YOUR TEST Not all test results are available during your visit. If your test results are not back during the visit, make an appointment with your caregiver to find out the results. Do not assume everything is normal if you have not heard from your  caregiver or the medical facility. It is important for you to follow up on all of your test results.  SEEK IMMEDIATE MEDICAL ATTENTION IF: You have more than a spotting of blood in your stool.  Your belly is swollen (abdominal distention).  You are nauseated or vomiting.  You have a temperature over 101.  You have abdominal pain or discomfort that is severe or gets worse throughout the day.   Your colonoscopy was relatively unremarkable.  I did not find any polyps or evidence of colon cancer.  There was a tattoo from previous polypectomy. I examined this site and it looked good. I did take biopsies to ensure no polyp tissue remains. We will call you next week with these results. Otherwise, I recommend repeating colonoscopy in 10 years for colon cancer screening purposes.    You do have diverticulosis and internal hemorrhoids. I would recommend increasing fiber in your diet or adding OTC Benefiber/Metamucil. Be sure to drink at least 4 to 6 glasses of water daily. Follow-up with GI as needed.   I hope you have a great rest of your week!  Elon Alas. Abbey Chatters, D.O. Gastroenterology and Hepatology [facility]

## 2020-08-10 NOTE — Transfer of Care (Signed)
Immediate Anesthesia Transfer of Care Note  Patient: Derek Wilkerson  Procedure(s) Performed: COLONOSCOPY WITH PROPOFOL  Patient Location: Endoscopy Unit  Anesthesia Type:General  Level of Consciousness: awake  Airway & Oxygen Therapy: Patient Spontanous Breathing  Post-op Assessment: Report given to RN and Post -op Vital signs reviewed and stable  Post vital signs: Reviewed and stable  Last Vitals:  Vitals Value Taken Time  BP    Temp    Pulse    Resp    SpO2      Last Pain:  Vitals:   08/10/20 1238  TempSrc:   PainSc: 2       Patients Stated Pain Goal: 5 (33/83/29 1916)  Complications: No notable events documented.

## 2020-08-10 NOTE — Anesthesia Preprocedure Evaluation (Signed)
Anesthesia Evaluation  Patient identified by MRN, date of birth, ID band Patient awake    Reviewed: Allergy & Precautions, H&P , NPO status , Patient's Chart, lab work & pertinent test results, reviewed documented beta blocker date and time   Airway Mallampati: II  TM Distance: >3 FB Neck ROM: full    Dental no notable dental hx. (+) Teeth Intact   Pulmonary neg pulmonary ROS, former smoker,    Pulmonary exam normal breath sounds clear to auscultation       Cardiovascular Exercise Tolerance: Good hypertension, negative cardio ROS   Rhythm:regular Rate:Normal     Neuro/Psych  Neuromuscular disease negative psych ROS   GI/Hepatic Neg liver ROS, GERD  Medicated,  Endo/Other  negative endocrine ROS  Renal/GU negative Renal ROS  negative genitourinary   Musculoskeletal   Abdominal   Peds  Hematology negative hematology ROS (+)   Anesthesia Other Findings   Reproductive/Obstetrics negative OB ROS                             Anesthesia Physical Anesthesia Plan  ASA: 2  Anesthesia Plan: General   Post-op Pain Management:    Induction:   PONV Risk Score and Plan: Propofol infusion  Airway Management Planned:   Additional Equipment:   Intra-op Plan:   Post-operative Plan:   Informed Consent: I have reviewed the patients History and Physical, chart, labs and discussed the procedure including the risks, benefits and alternatives for the proposed anesthesia with the patient or authorized representative who has indicated his/her understanding and acceptance.     Dental Advisory Given  Plan Discussed with: CRNA  Anesthesia Plan Comments:         Anesthesia Quick Evaluation

## 2020-08-10 NOTE — Anesthesia Postprocedure Evaluation (Signed)
Anesthesia Post Note  Patient: Derek Wilkerson  Procedure(s) Performed: COLONOSCOPY WITH PROPOFOL  Patient location during evaluation: Phase II Anesthesia Type: General Level of consciousness: awake Pain management: pain level controlled Vital Signs Assessment: post-procedure vital signs reviewed and stable Respiratory status: spontaneous breathing and respiratory function stable Cardiovascular status: blood pressure returned to baseline and stable Postop Assessment: no headache and no apparent nausea or vomiting Anesthetic complications: no Comments: Late entry   No notable events documented.   Last Vitals:  Vitals:   08/10/20 1215 08/10/20 1258  BP: (!) 156/85 122/75  Pulse: 74   Resp: 16 16  Temp: 36.8 C 36.7 C  SpO2: 96% 96%    Last Pain:  Vitals:   08/10/20 1258  TempSrc: Oral  PainSc: 0-No pain                 Louann Sjogren

## 2020-08-13 LAB — SURGICAL PATHOLOGY

## 2020-08-15 ENCOUNTER — Encounter: Payer: Self-pay | Admitting: *Deleted

## 2020-08-16 ENCOUNTER — Encounter (HOSPITAL_COMMUNITY): Payer: Self-pay | Admitting: Internal Medicine

## 2021-10-29 ENCOUNTER — Encounter (INDEPENDENT_AMBULATORY_CARE_PROVIDER_SITE_OTHER): Payer: Self-pay | Admitting: Ophthalmology

## 2021-11-12 ENCOUNTER — Encounter (INDEPENDENT_AMBULATORY_CARE_PROVIDER_SITE_OTHER): Payer: Medicare PPO | Admitting: Ophthalmology

## 2021-11-12 DIAGNOSIS — I10 Essential (primary) hypertension: Secondary | ICD-10-CM

## 2021-11-12 DIAGNOSIS — H35033 Hypertensive retinopathy, bilateral: Secondary | ICD-10-CM

## 2021-11-12 DIAGNOSIS — H35373 Puckering of macula, bilateral: Secondary | ICD-10-CM

## 2022-04-16 ENCOUNTER — Encounter: Payer: Self-pay | Admitting: Internal Medicine

## 2022-04-16 ENCOUNTER — Ambulatory Visit (INDEPENDENT_AMBULATORY_CARE_PROVIDER_SITE_OTHER): Payer: Medicare HMO | Admitting: Internal Medicine

## 2022-04-16 VITALS — BP 155/99 | HR 97 | Temp 98.2°F | Ht 69.0 in | Wt 181.4 lb

## 2022-04-16 DIAGNOSIS — K5904 Chronic idiopathic constipation: Secondary | ICD-10-CM | POA: Diagnosis not present

## 2022-04-16 DIAGNOSIS — K219 Gastro-esophageal reflux disease without esophagitis: Secondary | ICD-10-CM

## 2022-04-16 DIAGNOSIS — R1033 Periumbilical pain: Secondary | ICD-10-CM

## 2022-04-16 NOTE — Patient Instructions (Signed)
For your chronic constipation, I want you to start taking Linzess 145 mcg each morning (2 of your current prescription capsules).  Linzess works best when taken once a day every day, on an empty stomach, at least 30 minutes before your first meal of the day.  When Linzess is taken daily as directed:  *Constipation relief is typically felt in about a week *IBS-C patients may begin to experience relief from belly pain and overall abdominal symptoms (pain, discomfort, and bloating) in about 1 week,   with symptoms typically improving over 12 weeks.  Diarrhea may occur in the first 2 weeks -keep taking it.  The diarrhea should go away and you should start having normal, complete, full bowel movements. It may be helpful to start treatment when you can be near the comfort of your own bathroom, such as a weekend.   Let me know next week how you are doing.  We have options for 72 mcg, 145 mcg, 290 mcg depending on how you respond.  Continue on Prevacid for your chronic reflux as well as with famotidine.  Follow-up in 4 to 6 weeks.  We may need to consider upper endoscopy to further evaluate though will hold off for now.  It was nice seeing you again today.  Dr. Abbey Chatters

## 2022-04-16 NOTE — Progress Notes (Signed)
Referring Provider: Elby Showers, DO Primary Care Physician:  Elby Showers, DO Primary GI:  Dr. Abbey Chatters  Chief Complaint  Patient presents with   Constipation    Patient here today due to several issues. Patient says he has not had a bm in eight days until yesterday . He says he passed a small amount yesterday and then again today. Patient says he is taking linzess, which he started today given to him by pcp. He has issues also with gerd, and says he is taking Famotidine 40 mg and Prevacid 30 mg . Patient states he has a hiatal hernia also.     HPI:   Derek Wilkerson is a 67 y.o. male who presents to clinic today for follow-up visit.  Previously seen 2022.  Recent admission due to apparent TIA, started on Plavix.  States since his hospital admission, everything has gone haywire GI wise.  Has chronic GERD, previously well-controlled pantoprazole though notes breakthrough symptoms with heartburn, epigastric discomfort.  Pain is mild to moderate, burning in sensation, intermittent.  Worse after certain foods.  Recently switched to Prevacid 30 mg daily and famotidine 40 mg.  Started this a few days ago.  Also has worsening constipation.  States he has not had a bowel movement in 8 days though had a small bowel movement this morning.  Was given prescription for Linzess 72 mcg by his PCP Dr. Marijean Bravo though states he just started taking this medication this morning.  Has tried multiple over-the-counter remedies.  Also notes some periumbilical abdominal pain.  Mild to moderate, dull.  Colonoscopy 08/10/2020 with sigmoid diverticulosis, internal hemorrhoids, otherwise unremarkable.  Recommended 10-year recall.  CT abdomen pelvis with contrast 04/11/2022 with air-fluid levels within the colon, possibly diarrheal causing process.  Sigmoid diverticulosis, small hiatal hernia.  Right upper quadrant ultrasound 04/11/2022 with unremarkable gallbladder.  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 since age 8    Past Surgical History:  Procedure Laterality Date   BACK SURGERY  2019   L5, S1 fusion   CARPAL TUNNEL RELEASE Right 09/14/2014   Procedure: RIGHT CARPAL TUNNEL RELEASE;  Surgeon: Leanora Cover, MD;  Location: [place];  Service: Orthopedics;  Laterality: Right;   COLONOSCOPY     COLONOSCOPY N/A 03/31/2014   Dr. Oneida Alar: One large sigmoid polyp removed. One small transverse colon polyp removed. Moderate internal hemorrhoids. path with sessile serrated polyp and leiomyoma of transverse colon, sigmoid tubular adenoma. Surveillance 2019   COLONOSCOPY WITH PROPOFOL N/A 08/10/2020   Procedure: COLONOSCOPY WITH PROPOFOL;  Surgeon: Eloise Harman, DO;  [facility];  Service: Endoscopy;  Laterality: N/A;  12:30pm   ESOPHAGOGASTRODUODENOSCOPY N/A 03/31/2014   Dr. Oneida Alar: 1. Stricture at the gastroesophageal junction 2. mild non-erosive gastritis and duodentitis 3. moderate hiatal hernia   GANGLION CYST EXCISION Right 09/14/2014   Procedure: REMOVAL GANGLION CYST WRIST;  Surgeon: Leanora Cover, MD;  Location: [place];  Service: Orthopedics;  Laterality: Right;   LASIK Bilateral 02/24/1997   MALONEY DILATION N/A 03/31/2014   Procedure: [name] DILATION;  Surgeon: [name], MD;  [facility];  Service: Endoscopy;  Laterality: N/A;   NASAL SINUS SURGERY  SAVORY DILATION N/A 03/31/2014   Procedure: SAVORY DILATION;  Surgeon: [name], MD;  [facility];  Service: Endoscopy;  Laterality: N/A;   SHOULDER ARTHROSCOPY W/ ROTATOR CUFF REPAIR Right 02/25/1999    Current Outpatient Medications  Medication Sig Dispense Refill   Carboxymethylcellul-Glycerin (LUBRICATING EYE DROPS OP) Place  1 drop into both eyes daily as needed (dry eyes).     famotidine (PEPCID) 40 MG tablet Take 1 tablet by mouth at bedtime.     hydrochlorothiazide (MICROZIDE) 12.5 MG capsule Take 12.5 mg by mouth daily.     lansoprazole (PREVACID) 30 MG capsule Take 1 capsule by mouth daily.     losartan (COZAAR) 100 MG tablet Take 100 mg by mouth daily.     Milk Thistle 1000 MG CAPS Take 1,000 mg by mouth daily.     Saw Palmetto 450 MG CAPS Take 450 mg by mouth daily.     sertraline (ZOLOFT) 100 MG tablet Take 150 mg by mouth daily.  2   testosterone cypionate (DEPOTESTOSTERONE CYPIONATE) 200 MG/ML injection Inject 200 mg into the muscle every 14 (fourteen) days.     No current facility-administered medications for this visit.    Allergies as of 04/16/2022 - Review Complete 04/16/2022  Allergen Reaction Noted   Rosuvastatin  08/08/2020   Vytorin [ezetimibe-simvastatin] Other (See Comments) 04/02/2015    Family History  Problem Relation Age of Onset   Colon cancer Maternal Grandmother    Heart attack Mother     Social History   Socioeconomic History   Marital status: Married    Spouse name: Not on file   Number of children: Not on file   Years of education: Not on file   Highest education level: Not on file  Occupational History   Occupation: [employer]  Tobacco Use   Smoking status: Former    Types: Cigarettes   Smokeless tobacco: Never  Vaping Use   Vaping Use: Former  Substance and Sexual Activity   Alcohol use: Yes    Alcohol/week: 0.0 standard drinks of alcohol    Comment: occasionally   Drug use: No   Sexual activity: Not on file  Other Topics Concern   Not on file  Social History Narrative   Not on file   Social Determinants of Health   Financial Resource Strain: Not on file  Food Insecurity: Not on file  Transportation Needs: Not on file  Physical Activity: Not on file  Stress: Not on file  Social Connections: Not on file    Subjective: Review of Systems   Constitutional:  Negative for chills and fever.  HENT:  Negative for congestion and hearing loss.   Eyes:  Negative for blurred vision and double vision.  Respiratory:  Negative for cough and shortness of breath.   Cardiovascular:  Negative for chest pain and palpitations.  Gastrointestinal:  Positive for abdominal pain, constipation and heartburn. Negative for blood in stool, diarrhea, melena and vomiting.  Genitourinary:  Negative for dysuria and urgency.  Musculoskeletal:  Negative for joint pain and myalgias.  Skin:  Negative for itching and rash.  Neurological:  Negative for dizziness and headaches.  Psychiatric/Behavioral:  Negative for depression. The patient is not nervous/anxious.      Objective: BP (!) 155/99 (BP Location: Left Arm, Patient Position: Sitting, Cuff Size: Normal)   Pulse 97   Temp 98.2 F (36.8 C) (Temporal)   Ht 5' 9"$  (1.753 m)   Wt 181 lb 6.4 oz (82.3 kg)  BMI 26.79 kg/m  Physical Exam Constitutional:      Appearance: Normal appearance.  HENT:     Head: Normocephalic and atraumatic.  Eyes:     Extraocular Movements: Extraocular movements intact.     Conjunctiva/sclera: Conjunctivae normal.  Cardiovascular:     Rate and Rhythm: Normal rate and regular rhythm.  Pulmonary:     Effort: Pulmonary effort is normal.     Breath sounds: Normal breath sounds.  Abdominal:     General: Bowel sounds are normal.     Palpations: Abdomen is soft.  Musculoskeletal:        General: Normal range of motion.     Cervical back: Normal range of motion and neck supple.  Skin:    General: Skin is warm.  Neurological:     General: No focal deficit present.     Mental Status: He is alert and oriented to person, place, and time.  Psychiatric:        Mood and Affect: Mood normal.        Behavior: Behavior normal.      Assessment: *Chronic GERD *Constipation  *Abdominal pain-periumbilical   Plan: GERD not well-controlled.  Recently switched from  pantoprazole to Prevacid and famotidine.  I think he needs a little more time on this new medication to see if it is more efficacious, we will continue.    May need to make further adjustments depending on how he does.  May need to consider upper endoscopy to further evaluate.  In regards to his constipation, recommended increasing Linzess to 145 mcg daily.  Call with update next week, and I will send in formal prescription.  Counseled on initial washout period.  Counseled on different dosages including 72, 145, 290 mcg.  Follow-up in 4 to 6 weeks.  04/16/2022 1:37 PM   Disclaimer: This note was dictated with voice recognition software. Similar sounding words can inadvertently be transcribed and may not be corrected upon review.

## 2022-04-21 ENCOUNTER — Ambulatory Visit: Payer: BC Managed Care – PPO | Admitting: Gastroenterology

## 2022-04-23 ENCOUNTER — Telehealth: Payer: Self-pay

## 2022-04-23 NOTE — Telephone Encounter (Signed)
Pt phoned to thank you for helping him last week. He states everything is wonderful

## 2022-10-15 ENCOUNTER — Ambulatory Visit: Payer: Medicare HMO | Admitting: Internal Medicine

## 2022-11-12 ENCOUNTER — Ambulatory Visit: Payer: Medicare HMO | Admitting: Internal Medicine

## 2023-09-17 ENCOUNTER — Ambulatory Visit (INDEPENDENT_AMBULATORY_CARE_PROVIDER_SITE_OTHER): Admitting: Gastroenterology

## 2023-09-17 ENCOUNTER — Encounter (INDEPENDENT_AMBULATORY_CARE_PROVIDER_SITE_OTHER): Payer: Self-pay | Admitting: Gastroenterology

## 2023-09-17 VITALS — BP 133/75 | HR 69 | Temp 98.0°F | Ht 68.0 in | Wt 169.1 lb

## 2023-09-17 DIAGNOSIS — K219 Gastro-esophageal reflux disease without esophagitis: Secondary | ICD-10-CM | POA: Diagnosis not present

## 2023-09-17 DIAGNOSIS — K5900 Constipation, unspecified: Secondary | ICD-10-CM

## 2023-09-17 DIAGNOSIS — K5904 Chronic idiopathic constipation: Secondary | ICD-10-CM

## 2023-09-17 MED ORDER — LANSOPRAZOLE 30 MG PO CPDR: 30.0000 mg | DELAYED_RELEASE_CAPSULE | Freq: Every day | ORAL | 3 refills | Status: AC

## 2023-09-17 MED ORDER — LINACLOTIDE 72 MCG PO CAPS: 72.0000 ug | ORAL_CAPSULE | Freq: Every day | ORAL | 1 refills | Status: DC

## 2023-09-17 NOTE — Progress Notes (Signed)
 Referring Provider: Primus Alan Goldberg, DO Primary Care Physician:  Primus Alan Goldberg, DO Primary GI Physician: Dr. Cindie   Chief Complaint  Patient presents with   Constipation    Pt arrives due to constipation for about 2 months. Having to strain really hard. Pt has noticed bleeding when straining really hard. Pt has been taking laxatives but not in the past few days. Did have some abdominal pain earlier this week.    HPI:   Derek Wilkerson is a 68 y.o. male with past medical history of arthritis, carpal tunnel GERD, HTN and TIA now on plavix, GERD and constipation   Patient presenting today for:  Follow up of Constipation and GERD   Last seen in February 2024 by Dr. Cindie, at that time, having more breakthrough heartburn, epigastric pain, worse after certain foods, recently switched to prevacid  30mg  daily famotidine 40mg  a few days prior. Also reported worsening constipation, started on Linzess  72mcg by his PCP which he had just started that day. Some periumbilical abdominal pain.   Recommended continue prevacid  30mg /famotidine 40mg  daily, consdier EGD at follow up pending clinical course, increase linzess  to 145mcg daily.  Present: States for the past few months he has had more constipation, 1 on bristol stool scale. He notes he was on linzess  previously, he thinks it worked well for him. He states that he was taking a lot of meds and stopped taking this as he was trying to get off of some things. He has taken metamucil PRN without much improvement. He started fiber product on Monday and notes today he had a good smooth BM. He does the [employer], does a lot of water   (60 oz of water ) and juice. Notes he got off track over the past few weeks, ate some foods not on his diet and consumed a small amount of alcohol so he wonders if this contributed. He has seen small amount of blood when wiping on 3-4 occasions where he notes he had to strain almost to the point of passing out to  defecate. He does have some pain to RLQ and into his back that came on after he had an episode of significant straining a few nights ago, has not taken anything for this. he gets very stressed and anxious when he feels he is becoming constipated.   Notably labs in January with normal TSH and CMP   Acid reflux has flared up over the past few months, he did stop taking prevacid  and famotidine a few months ago when he stopped some of his medications, as above.  He is having more heartburn. No acid regurgitation. Having symptoms a couple of days per day, does not take anything for symptoms. Denies dysphagia or odynophagia. No weight loss, changes in appetite, nausea or vomiting.   Colonoscopy 08/10/2020 with sigmoid diverticulosis, internal hemorrhoids, otherwise unremarkable.  Recommended 10-year recall. CT abdomen pelvis with contrast 04/11/2022 with air-fluid levels within the colon, possibly diarrheal causing process.  Sigmoid diverticulosis, small hiatal hernia. Right upper quadrant ultrasound 04/11/2022 with unremarkable gallbladder.  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 since age 32    Past Surgical History:  Procedure Laterality Date   BACK SURGERY  2019   L5, S1  fusion   CARPAL TUNNEL RELEASE Right 09/14/2014   Procedure: RIGHT CARPAL TUNNEL RELEASE;  Surgeon: Franky Curia, MD;  Location: [facility];  Service: Orthopedics;  Laterality: Right;   COLONOSCOPY     COLONOSCOPY N/A 03/31/2014   Dr. Harvey: One large sigmoid polyp removed. One small transverse colon polyp removed. Moderate internal hemorrhoids. path with sessile serrated polyp and leiomyoma of transverse colon, sigmoid tubular adenoma. Surveillance 2019   COLONOSCOPY WITH PROPOFOL  N/A  08/10/2020   Procedure: COLONOSCOPY WITH PROPOFOL ;  Surgeon: Cindie Carlin POUR, DO;  [facility];  Service: Endoscopy;  Laterality: N/A;  12:30pm   ESOPHAGOGASTRODUODENOSCOPY N/A 03/31/2014   Dr. Harvey: 1. Stricture at the gastroesophageal junction 2. mild non-erosive gastritis and duodentitis 3. moderate hiatal hernia   GANGLION CYST EXCISION Right 09/14/2014   Procedure: REMOVAL GANGLION CYST WRIST;  Surgeon: Franky Curia, MD;  Location: [facility];  Service: Orthopedics;  Laterality: Right;   LASIK Bilateral 02/24/1997   MALONEY DILATION N/A 03/31/2014   Procedure: AGAPITO DILATION;  Surgeon: Margo LITTIE Harvey, MD;  [facility];  Service: Endoscopy;  Laterality: N/A;   NASAL SINUS SURGERY     SAVORY DILATION N/A 03/31/2014   Procedure: SAVORY DILATION;  Surgeon: Margo LITTIE Harvey, MD;  [facility];  Service: Endoscopy;  Laterality: N/A;   SHOULDER ARTHROSCOPY W/ ROTATOR CUFF REPAIR Right 02/25/1999    Current Outpatient Medications  Medication Sig Dispense Refill   Carboxymethylcellul-Glycerin (LUBRICATING EYE DROPS OP) Place 1 drop into both eyes daily as needed (dry eyes).     famotidine (PEPCID) 40 MG tablet Take 1 tablet by mouth at bedtime.     hydrochlorothiazide (MICROZIDE) 12.5 MG capsule Take 12.5 mg by mouth daily.     lansoprazole  (PREVACID ) 30 MG capsule Take 1 capsule by mouth daily.     losartan  (COZAAR ) 100 MG tablet Take 100 mg by mouth daily.     Milk Thistle 1000 MG CAPS Take 1,000 mg by mouth daily.     Saw Palmetto 450 MG CAPS Take 450 mg by mouth daily.     sertraline (ZOLOFT) 100 MG tablet Take 150 mg by mouth daily.  2   testosterone cypionate (DEPOTESTOSTERONE CYPIONATE) 200 MG/ML injection Inject 200 mg into the muscle every 14 (fourteen) days.     No current facility-administered medications for this visit.    Allergies as of 09/17/2023 - Review Complete 09/17/2023  Allergen Reaction Noted   Rosuvastatin    08/08/2020   Vytorin [ezetimibe-simvastatin] Other (See Comments) 04/02/2015    Social History   Socioeconomic History   Marital status: Married    Spouse name: Not on file   Number of children: Not on file   Years of education: Not on file   Highest education level: Not on file  Occupational History   Occupation: [employer]  Tobacco Use   Smoking status: Former    Types: Cigarettes   Smokeless tobacco: Never  Vaping Use   Vaping status: Former  Substance and Sexual Activity   Alcohol use: Yes    Alcohol/week: 0.0 standard drinks of alcohol    Comment: occasionally   Drug use: No   Sexual activity: Not on file  Other Topics Concern   Not on file  Social History Narrative   Not on file   Social Drivers of Health   Financial Resource Strain: Low Risk  (04/01/2022)   Received from [facility]   Overall Financial Resource Strain (  CARDIA)    Difficulty of Paying Living Expenses: Not very hard  Food Insecurity: Low Risk  (09/03/2022)   Received from [facility]   Hunger Vital Sign    Within the past 12 months, you worried that your food would run out before you got money to buy more: Never true    Within the past 12 months, the food you bought just didn't last and you didn't have money to get more. : Never true  Transportation Needs: Not on file (09/03/2022)  Physical Activity: Unknown (04/01/2022)   Received from [facility]   Exercise Vital Sign    On average, how many days per week do you engage in moderate to strenuous exercise (like a brisk walk)?: Patient declined    On average, how many minutes do you engage in exercise at this level?: 0 min  Stress: Stress Concern Present (04/01/2022)   Received from [facility] - Occupational Stress Questionnaire    Feeling of Stress : To some extent  Social Connections: Moderately Integrated (04/01/2022)   Received from [facility]   Social Network    How would you rate  your social network (family, work, friends)?: Adequate participation with social networks    Review of systems General: negative for malaise, night sweats, fever, chills, weight loss Neck: Negative for lumps, goiter, pain and significant neck swelling Resp: Negative for cough, wheezing, dyspnea at rest CV: Negative for chest pain, leg swelling, palpitations, orthopnea GI: denies melena, nausea, vomiting, diarrhea, dysphagia, odyonophagia, early satiety or unintentional weight loss. +heartburn +constipation +toilet tissue heamtochezia MSK: Negative for joint pain or swelling, back pain, and muscle pain. Derm: Negative for itching or rash Psych: Denies depression, anxiety, memory loss, confusion. No homicidal or suicidal ideation.  Heme: Negative for prolonged bleeding, bruising easily, and swollen nodes. Endocrine: Negative for cold or heat intolerance, polyuria, polydipsia and goiter. Neuro: negative for tremor, gait imbalance, syncope and seizures. The remainder of the review of systems is noncontributory.  Physical Exam: BP 133/75   Pulse 69   Temp 98 F (36.7 C)   Ht 5' 8 (1.727 m)   Wt 169 lb 1.6 oz (76.7 kg)   BMI 25.71 kg/m  General:   Alert and oriented. No distress noted. Pleasant and cooperative.  Head:  Normocephalic and atraumatic. Eyes:  Conjuctiva clear without scleral icterus. Mouth:  Oral mucosa pink and moist. Good dentition. No lesions. Heart: Normal rate and rhythm, s1 and s2 heart sounds present.  Lungs: Clear lung sounds in all lobes. Respirations equal and unlabored. Abdomen:  +BS, soft, non-tender and non-distended. No rebound or guarding. No HSM or masses noted. Derm: No palmar erythema or jaundice Msk:  Symmetrical without gross deformities. Normal posture. Extremities:  Without edema. Neurologic:  Alert and  oriented x4 Psych:  Alert and cooperative. Normal mood and affect.  Invalid input(s): 6 MONTHS   ASSESSMENT: CAYLON SAINE is a 68 y.o. male  presenting today for follow up of constipation and GERD  Constipation: flared up recently after stopping linzess  which he seemed to have had improvement on previously. His diet is relatively healthy and he has good water  intake. Recently started a fiber supplement which seems to be improving his constipation some. Recent CMP and TSH were WNL. He has had a few episodes of toilet tissue hematochezia in setting of significant straining, relatively recent Colonoscopy in 2022 with presence of hemorrhoids. At this time, would recommend restart linzess  at 72mcg daily,  he can continue with his fiber supplement for now. Should continue to consume atleast 60oz of water  per day. He will let me know if symptoms are not improved over the next few weeks. If bleeding persists or constipation fails to improve, would recommend updating colonoscopy.  We did also discuss abdominal cramping and diarrhea are common when starting linzess  and these symptoms should improve over time.  GERD: previously on prevacid  30mg /famotidine 40mg , noting he did well on these but also stopped them at some point over the past few months. Having more heartburn now, a couple of days per week. No dysphagia, odynophagia, nausea, vomiting, weight loss or early satiety. Will restart prevacid  30mg  daily for now, instructed on good reflux precautions, he will make me aware if he has persistent symptoms as we may need to add H2B back as well.    PLAN:  -restart prevacid  30mg  daily -restart linzess  72mcg -good reflux precautions -continue with good water  intake, mediterranean diet -can continue fiber supplement for now  All questions were answered, patient verbalized understanding and is in agreement with plan as outlined above.   Follow Up: 3 months   Maegen Wigle L. Liahna Brickner, MSN, APRN, AGNP-C Adult-Gerontology Nurse Practitioner [facility]

## 2023-09-17 NOTE — Patient Instructions (Signed)
 It was very nice to meet you! -We will get you restarted on prevacid  30mg  daily for your heartburn, take this 30 minutes prior to first meal of the day -be mindful of greasy, spicy, fried, citrus foods, and be mindful that caffeine, carbonated drinks, chocolate and alcohol can increase reflux symptoms Stay upright 2-3 hours after eating, prior to lying down and avoid eating late in the evenings. -continue with good water  intake and mediterranean diet, I have sent linzess  to restart, take this daily. You may have some abdominal cramping and diarrhea in the first few weeks you are on this, this is to be expected -you can continue fiber supplement for now  Please let me know if symptoms are not improving over the next few weeks  Follow up 3 months   I want to create trusting relationships with patients and provide genuine, compassionate, and quality care. I truly value your feedback! please be on the lookout for a survey regarding your visit with me today. I appreciate your input about our visit and your time in completing this!    Derek Wilkerson L. Derek Werden, MSN, APRN, AGNP-C Adult-Gerontology Nurse Practitioner [facility] at [facility]

## 2023-10-22 ENCOUNTER — Other Ambulatory Visit (INDEPENDENT_AMBULATORY_CARE_PROVIDER_SITE_OTHER): Payer: Self-pay | Admitting: Gastroenterology

## 2023-12-07 ENCOUNTER — Encounter (INDEPENDENT_AMBULATORY_CARE_PROVIDER_SITE_OTHER): Payer: Self-pay | Admitting: Gastroenterology

## 2024-04-12 ENCOUNTER — Ambulatory Visit (INDEPENDENT_AMBULATORY_CARE_PROVIDER_SITE_OTHER): Admitting: Gastroenterology
